# Patient Record
Sex: Female | Born: 1980 | Race: Black or African American | Hispanic: No | Marital: Single | Smoking: Never smoker
Health system: Southern US, Community
[De-identification: ages and names within clinical notes are randomized; demographics above are authoritative.]

## PROBLEM LIST (undated history)

## (undated) DIAGNOSIS — I10 Essential (primary) hypertension: Secondary | ICD-10-CM

## (undated) DIAGNOSIS — D649 Anemia, unspecified: Secondary | ICD-10-CM

## (undated) DIAGNOSIS — M1990 Unspecified osteoarthritis, unspecified site: Secondary | ICD-10-CM

## (undated) DIAGNOSIS — M359 Systemic involvement of connective tissue, unspecified: Secondary | ICD-10-CM

## (undated) DIAGNOSIS — K859 Acute pancreatitis without necrosis or infection, unspecified: Secondary | ICD-10-CM

## (undated) HISTORY — DX: Anemia, unspecified: D64.9

## (undated) HISTORY — PX: ABDOMINAL HYSTERECTOMY: SHX81

## (undated) HISTORY — PX: OTHER SURGICAL HISTORY: SHX169

## (undated) HISTORY — DX: Acute pancreatitis without necrosis or infection, unspecified: K85.90

## (undated) HISTORY — DX: Essential (primary) hypertension: I10

## (undated) HISTORY — PX: CHOLECYSTECTOMY: SHX55

---

## 2004-01-25 ENCOUNTER — Emergency Department: Payer: Self-pay | Admitting: Emergency Medicine

## 2004-04-07 ENCOUNTER — Emergency Department: Payer: Self-pay | Admitting: Emergency Medicine

## 2004-04-16 ENCOUNTER — Emergency Department: Payer: Self-pay | Admitting: Emergency Medicine

## 2004-05-05 ENCOUNTER — Emergency Department: Payer: Self-pay | Admitting: Emergency Medicine

## 2005-04-20 ENCOUNTER — Emergency Department: Payer: Self-pay | Admitting: Emergency Medicine

## 2005-04-25 ENCOUNTER — Other Ambulatory Visit: Payer: Self-pay

## 2005-04-25 ENCOUNTER — Emergency Department: Payer: Self-pay | Admitting: Unknown Physician Specialty

## 2005-06-28 ENCOUNTER — Ambulatory Visit: Payer: Self-pay | Admitting: Vascular Surgery

## 2005-12-16 ENCOUNTER — Emergency Department: Payer: Self-pay | Admitting: General Practice

## 2006-01-04 ENCOUNTER — Other Ambulatory Visit: Payer: Self-pay

## 2006-01-04 ENCOUNTER — Emergency Department: Payer: Self-pay | Admitting: Emergency Medicine

## 2006-02-12 ENCOUNTER — Emergency Department: Payer: Self-pay | Admitting: Emergency Medicine

## 2006-04-16 ENCOUNTER — Emergency Department: Payer: Self-pay | Admitting: Emergency Medicine

## 2006-06-05 ENCOUNTER — Emergency Department: Payer: Self-pay | Admitting: Emergency Medicine

## 2006-07-15 ENCOUNTER — Emergency Department: Payer: Self-pay | Admitting: Emergency Medicine

## 2006-07-15 ENCOUNTER — Other Ambulatory Visit: Payer: Self-pay

## 2006-09-19 ENCOUNTER — Emergency Department: Payer: Self-pay | Admitting: Emergency Medicine

## 2006-11-04 ENCOUNTER — Emergency Department: Payer: Self-pay | Admitting: Internal Medicine

## 2007-11-13 ENCOUNTER — Observation Stay: Payer: Self-pay

## 2007-11-13 ENCOUNTER — Emergency Department: Payer: Self-pay | Admitting: Emergency Medicine

## 2007-11-14 ENCOUNTER — Inpatient Hospital Stay: Payer: Self-pay | Admitting: Obstetrics and Gynecology

## 2008-02-12 ENCOUNTER — Observation Stay: Payer: Self-pay | Admitting: Obstetrics and Gynecology

## 2008-02-27 ENCOUNTER — Observation Stay: Payer: Self-pay | Admitting: Obstetrics and Gynecology

## 2008-03-06 ENCOUNTER — Inpatient Hospital Stay: Payer: Self-pay | Admitting: Obstetrics and Gynecology

## 2008-03-17 ENCOUNTER — Ambulatory Visit: Payer: Self-pay | Admitting: Obstetrics and Gynecology

## 2009-01-06 ENCOUNTER — Emergency Department: Payer: Self-pay | Admitting: Emergency Medicine

## 2009-01-09 ENCOUNTER — Emergency Department: Payer: Self-pay | Admitting: Unknown Physician Specialty

## 2009-01-15 ENCOUNTER — Ambulatory Visit: Payer: Self-pay | Admitting: Gastroenterology

## 2009-02-01 ENCOUNTER — Ambulatory Visit: Payer: Self-pay | Admitting: Gastroenterology

## 2010-01-26 ENCOUNTER — Ambulatory Visit: Payer: Self-pay | Admitting: Obstetrics and Gynecology

## 2010-01-27 ENCOUNTER — Inpatient Hospital Stay: Payer: Self-pay

## 2010-01-29 LAB — PATHOLOGY REPORT

## 2012-03-04 ENCOUNTER — Emergency Department: Payer: Self-pay | Admitting: Emergency Medicine

## 2012-03-04 LAB — MONONUCLEOSIS SCREEN: Mono Test: NEGATIVE

## 2012-03-07 LAB — BETA STREP CULTURE(ARMC)

## 2013-09-11 ENCOUNTER — Emergency Department: Payer: Self-pay | Admitting: Emergency Medicine

## 2013-09-11 LAB — BASIC METABOLIC PANEL
Anion Gap: 2 — ABNORMAL LOW (ref 7–16)
BUN: 11 mg/dL (ref 7–18)
CO2: 29 mmol/L (ref 21–32)
Calcium, Total: 8.7 mg/dL (ref 8.5–10.1)
Chloride: 110 mmol/L — ABNORMAL HIGH (ref 98–107)
Creatinine: 0.79 mg/dL (ref 0.60–1.30)
Glucose: 106 mg/dL — ABNORMAL HIGH (ref 65–99)
OSMOLALITY: 281 (ref 275–301)
POTASSIUM: 3.8 mmol/L (ref 3.5–5.1)
Sodium: 141 mmol/L (ref 136–145)

## 2013-09-11 LAB — CBC WITH DIFFERENTIAL/PLATELET
BASOS PCT: 0.4 %
Basophil #: 0 10*3/uL (ref 0.0–0.1)
Eosinophil #: 0 10*3/uL (ref 0.0–0.7)
Eosinophil %: 0.4 %
HCT: 31.6 % — AB (ref 35.0–47.0)
HGB: 9.8 g/dL — ABNORMAL LOW (ref 12.0–16.0)
LYMPHS PCT: 24.8 %
Lymphocyte #: 2.7 10*3/uL (ref 1.0–3.6)
MCH: 20.8 pg — AB (ref 26.0–34.0)
MCHC: 31 g/dL — ABNORMAL LOW (ref 32.0–36.0)
MCV: 67 fL — AB (ref 80–100)
Monocyte #: 0.8 x10 3/mm (ref 0.2–0.9)
Monocyte %: 6.9 %
NEUTROS PCT: 67.5 %
Neutrophil #: 7.3 10*3/uL — ABNORMAL HIGH (ref 1.4–6.5)
PLATELETS: 321 10*3/uL (ref 150–440)
RBC: 4.7 10*6/uL (ref 3.80–5.20)
RDW: 18.9 % — AB (ref 11.5–14.5)
WBC: 10.9 10*3/uL (ref 3.6–11.0)

## 2014-02-01 ENCOUNTER — Emergency Department: Payer: Self-pay | Admitting: Emergency Medicine

## 2014-02-02 LAB — CBC WITH DIFFERENTIAL/PLATELET
BASOS PCT: 0.4 %
Basophil #: 0.1 10*3/uL (ref 0.0–0.1)
EOS ABS: 0.1 10*3/uL (ref 0.0–0.7)
Eosinophil %: 0.8 %
HCT: 29.9 % — AB (ref 35.0–47.0)
HGB: 9.4 g/dL — ABNORMAL LOW (ref 12.0–16.0)
Lymphocyte #: 4.2 10*3/uL — ABNORMAL HIGH (ref 1.0–3.6)
Lymphocyte %: 30.8 %
MCH: 21 pg — ABNORMAL LOW (ref 26.0–34.0)
MCHC: 31.4 g/dL — AB (ref 32.0–36.0)
MCV: 67 fL — AB (ref 80–100)
Monocyte #: 0.9 x10 3/mm (ref 0.2–0.9)
Monocyte %: 6.9 %
NEUTROS ABS: 8.3 10*3/uL — AB (ref 1.4–6.5)
Neutrophil %: 61.1 %
Platelet: 367 10*3/uL (ref 150–440)
RBC: 4.47 10*6/uL (ref 3.80–5.20)
RDW: 18.9 % — AB (ref 11.5–14.5)
WBC: 13.5 10*3/uL — ABNORMAL HIGH (ref 3.6–11.0)

## 2014-02-02 LAB — COMPREHENSIVE METABOLIC PANEL
ANION GAP: 7 (ref 7–16)
Albumin: 3.4 g/dL (ref 3.4–5.0)
Alkaline Phosphatase: 88 U/L
BUN: 18 mg/dL (ref 7–18)
Bilirubin,Total: 0.2 mg/dL (ref 0.2–1.0)
CO2: 29 mmol/L (ref 21–32)
Calcium, Total: 8.6 mg/dL (ref 8.5–10.1)
Chloride: 102 mmol/L (ref 98–107)
Creatinine: 0.87 mg/dL (ref 0.60–1.30)
EGFR (African American): >60
Glucose: 130 mg/dL — ABNORMAL HIGH (ref 65–99)
OSMOLALITY: 279 (ref 275–301)
POTASSIUM: 3 mmol/L — AB (ref 3.5–5.1)
SGOT(AST): 25 U/L (ref 15–37)
SGPT (ALT): 16 U/L
Sodium: 138 mmol/L (ref 136–145)
Total Protein: 8.6 g/dL — ABNORMAL HIGH (ref 6.4–8.2)

## 2014-02-02 LAB — TROPONIN I

## 2014-02-02 LAB — LIPASE, BLOOD: LIPASE: 482 U/L — AB (ref 73–393)

## 2014-02-10 ENCOUNTER — Emergency Department: Payer: Self-pay | Admitting: Emergency Medicine

## 2014-02-10 LAB — CBC WITH DIFFERENTIAL/PLATELET
Basophil #: 0 10*3/uL (ref 0.0–0.1)
Basophil %: 0.1 %
EOS ABS: 0.1 10*3/uL (ref 0.0–0.7)
EOS PCT: 0.9 %
HCT: 30 % — ABNORMAL LOW (ref 35.0–47.0)
HGB: 9.6 g/dL — ABNORMAL LOW (ref 12.0–16.0)
Lymphocyte #: 0.9 10*3/uL — ABNORMAL LOW (ref 1.0–3.6)
Lymphocyte %: 11.2 %
MCH: 21.3 pg — ABNORMAL LOW (ref 26.0–34.0)
MCHC: 31.9 g/dL — ABNORMAL LOW (ref 32.0–36.0)
MCV: 67 fL — ABNORMAL LOW (ref 80–100)
MONO ABS: 0.5 x10 3/mm (ref 0.2–0.9)
MONOS PCT: 6.5 %
NEUTROS PCT: 81.3 %
Neutrophil #: 6.6 10*3/uL — ABNORMAL HIGH (ref 1.4–6.5)
PLATELETS: 279 10*3/uL (ref 150–440)
RBC: 4.48 10*6/uL (ref 3.80–5.20)
RDW: 18.9 % — ABNORMAL HIGH (ref 11.5–14.5)
WBC: 8.2 10*3/uL (ref 3.6–11.0)

## 2014-02-10 LAB — COMPREHENSIVE METABOLIC PANEL
AST: 76 U/L — AB (ref 15–37)
Albumin: 3.4 g/dL (ref 3.4–5.0)
Alkaline Phosphatase: 80 U/L (ref 46–116)
Anion Gap: 6 — ABNORMAL LOW (ref 7–16)
BILIRUBIN TOTAL: 0.2 mg/dL (ref 0.2–1.0)
BUN: 10 mg/dL (ref 7–18)
CALCIUM: 8.7 mg/dL (ref 8.5–10.1)
Chloride: 103 mmol/L (ref 98–107)
Co2: 30 mmol/L (ref 21–32)
Creatinine: 0.66 mg/dL (ref 0.60–1.30)
Glucose: 110 mg/dL — ABNORMAL HIGH (ref 65–99)
OSMOLALITY: 277 (ref 275–301)
Potassium: 3.4 mmol/L — ABNORMAL LOW (ref 3.5–5.1)
SGPT (ALT): 61 U/L (ref 14–63)
Sodium: 139 mmol/L (ref 136–145)
Total Protein: 7.8 g/dL (ref 6.4–8.2)

## 2014-02-10 LAB — LIPASE, BLOOD: LIPASE: 109 U/L (ref 73–393)

## 2014-06-12 ENCOUNTER — Ambulatory Visit: Payer: Self-pay | Admitting: Oncology

## 2014-06-22 ENCOUNTER — Ambulatory Visit: Payer: Medicaid Other | Admitting: Oncology

## 2014-06-30 ENCOUNTER — Telehealth: Payer: Self-pay | Admitting: *Deleted

## 2014-06-30 ENCOUNTER — Inpatient Hospital Stay: Payer: Medicaid Other | Attending: Hematology and Oncology | Admitting: Hematology and Oncology

## 2014-06-30 ENCOUNTER — Encounter: Payer: Self-pay | Admitting: Hematology and Oncology

## 2014-06-30 VITALS — BP 137/84 | HR 83 | Temp 97.5°F | Resp 20 | Ht 67.0 in | Wt 297.6 lb

## 2014-06-30 DIAGNOSIS — Z79899 Other long term (current) drug therapy: Secondary | ICD-10-CM | POA: Diagnosis not present

## 2014-06-30 DIAGNOSIS — Z800 Family history of malignant neoplasm of digestive organs: Secondary | ICD-10-CM

## 2014-06-30 DIAGNOSIS — N946 Dysmenorrhea, unspecified: Secondary | ICD-10-CM

## 2014-06-30 DIAGNOSIS — I10 Essential (primary) hypertension: Secondary | ICD-10-CM

## 2014-06-30 DIAGNOSIS — R5383 Other fatigue: Secondary | ICD-10-CM | POA: Diagnosis not present

## 2014-06-30 DIAGNOSIS — K861 Other chronic pancreatitis: Secondary | ICD-10-CM | POA: Diagnosis not present

## 2014-06-30 DIAGNOSIS — D509 Iron deficiency anemia, unspecified: Secondary | ICD-10-CM

## 2014-06-30 DIAGNOSIS — F508 Other eating disorders: Secondary | ICD-10-CM | POA: Diagnosis not present

## 2014-06-30 NOTE — Telephone Encounter (Signed)
Asked pt to return my call and let me know when she would like to schedule her Venofer infusion.Cheryl KitchenMarland Santana

## 2014-06-30 NOTE — Progress Notes (Signed)
Patient here today for initial evaluation regarding anemia, referred by [facility]. Patient reports history of anemia, also reports fatigue, weakness and shortness of breath.

## 2014-07-01 DIAGNOSIS — D509 Iron deficiency anemia, unspecified: Secondary | ICD-10-CM | POA: Insufficient documentation

## 2014-07-01 NOTE — Progress Notes (Signed)
Cheryl Santana day:  07/01/2014  Chief Complaint: [name] is a 34 y.o. female with anemia who is referred in consultation by Dr. Kirkland Hun.  HPI:  The patient states that she has had anemia "my whole life". She states that it "runs in the family" (both parents). She states that both her mother and herself had to have a transfusion after delivery. Her paternal aunt also required a blood transfusion.  The patient notes heavy menses. She states that she had her tubes tied in 2012.  She will typically go through a pack of pads or tampons (24) in 2 days. Menses typically last 5 days ; every day is heavy.   At times, she will become lightheaded. There has been some discussion about an endometrial ablation (not been scheduled). She denies any other bruising or bleeding.  She denies specifically any melena, hematochezia or hematuria.  She states that her diet is "not the healthiest".   She eats meat daily. She has some vegetables. She has had ice pica since the last trimester with her daughter (2011).  She will go to [employer] for sheets of ice. She craves the smell of things.  She states that oral iron causes constipation.  She has a history of recurrent pancreatitis.  She underwent endoscopy in 03/2014.  Biopsies were negative.  She stest that she had 2 C-sections and a cholecystectomy without bleeding issues.  Labs on 05/28/2014 revealed a hematocrit of 32, hemoglobin 9.2, MCV 71, platelets 295,000, white count 8100 with an ANC of 4700.  Comprehensive metabolic panel included a creatinine of 0.6. Liver function tests were normal. TSH was normal.  Iron studies revealed an iron saturation of 3% and a TIBC of 395.  Symptomatically, she feels pretty good. Her energy level is fair. She denies any abdominal pain. He states that she is "willing to do IV iron".  Past Medical History  Diagnosis Date  . Anemia   . Pancreatitis   . Hypertension     Past  Surgical History  Procedure Laterality Date  . C-section      Family History  Problem Relation Age of Onset  . Anemia Mother   . Hypertension Mother   . Anemia Paternal Aunt   . Anemia Cousin   . Hypertension Father   . Cancer Other     great aunt- colon cancer    Social History:  reports that she has never smoked. She does not have any smokeless tobacco history on file. She reports that she does not drink alcohol or use illicit drugs.  The patient is alone today.  Allergies: Not on File  Current Medications: Current Outpatient Prescriptions  Medication Sig Dispense Refill  . hydrochlorothiazide (HYDRODIURIL) 25 MG tablet Take 25 mg by mouth daily.     No current facility-administered medications for this visit.    Review of Systems:  GENERAL:  Energy level is fair.  No fevers, sweats or weight loss. PERFORMANCE STATUS (ECOG):  1 HEENT:  Runny nose.  No visual changes, sore throat, mouth sores or tenderness. Lungs: No shortness of breath or cough.  No hemoptysis. Cardiac:  No chest pain, palpitations, orthopnea, or PND. GI:  No nausea, vomiting, diarrhea, constipation, melena or hematochezia. GU:  Heavy menses.  No urgency, frequency, dysuria, or hematuria. Musculoskeletal:  No back pain.  No joint pain.  No muscle tenderness. Extremities:  No pain or swelling. Skin:  No rashes or skin changes. Neuro:  Headache.  No numbness or weakness, balance or coordination issues. Endocrine:  No diabetes, thyroid issues, hot flashes or night sweats. Psych:  No mood changes, depression or anxiety. Pain:  No focal pain. Review of systems:  All other systems reviewed and found to be negative.   Physical Exam: Blood pressure 137/84, pulse 83, temperature 97.5 F (36.4 C), temperature source Oral, resp. rate 20, height 5' 7"  (1.702 m), weight 297 lb 9.9 oz (134.999 kg), SpO2 100 %. GENERAL:  Well developed, well nourished, overweight woman sitting comfortably in the exam room in no  acute distress. MENTAL STATUS:  Alert and oriented to person, place and time. HEAD:  Brown shoulder length hair.  Normocephalic, atraumatic, face symmetric, no Cushingoid features. EYES:  Brown eyes.  Pupils equal round and reactive to light and accomodation.  No conjunctivitis or scleral icterus. ENT:  Oropharynx clear without lesion.  Tongue normal. Mucous membranes moist.  RESPIRATORY:  Clear to auscultation without rales, wheezes or rhonchi. CARDIOVASCULAR:  Regular rate and rhythm without murmur, rub or gallop. ABDOMEN:  Soft, non-tender, with active bowel sounds, and no appreciable hepatosplenomegaly.  No masses. SKIN:  No rashes, ulcers or lesions. EXTREMITIES: No edema, no skin discoloration or tenderness.  No palpable cords. LYMPH NODES: No palpable cervical, supraclavicular, axillary or inguinal adenopathy  NEUROLOGICAL: Unremarkable. PSYCH:  Appropriate.   No visits with results within 3 Day(s) from this visit. Latest known visit with results is:  Cheryl Santana Conversion on 02/10/2014  Component Date Value Ref Range Status  . WBC 02/10/2014 8.2  3.6-11.0 x10 3/mm 3 Final  . RBC 02/10/2014 4.48  3.80-5.20 X10 6/mm 3 Final  . HGB 02/10/2014 9.6* 12.0-16.0 g/dL Final  . HCT 02/10/2014 30.0* 35.0-47.0 % Final  . MCV 02/10/2014 67* 80-100 fL Final  . MCH 02/10/2014 21.3* 26.0-34.0 pg Final  . MCHC 02/10/2014 31.9* 32.0-36.0 g/dL Final  . RDW 02/10/2014 18.9* 11.5-14.5 % Final  . Platelet 02/10/2014 279  150-440 x10 3/mm 3 Final  . Neutrophil % 02/10/2014 81.3   Final  . Lymphocyte % 02/10/2014 11.2   Final  . Monocyte % 02/10/2014 6.5   Final  . Eosinophil % 02/10/2014 0.9   Final  . Basophil % 02/10/2014 0.1   Final  . Neutrophil # 02/10/2014 6.6* 1.4-6.5 x10 3/mm 3 Final  . Lymphocyte # 02/10/2014 0.9* 1.0-3.6 x10 3/mm 3 Final  . Monocyte # 02/10/2014 0.5  0.2-0.9 x10 3/mm  Final  . Eosinophil # 02/10/2014 0.1  0.0-0.7 x10 3/mm 3 Final  . Basophil # 02/10/2014 0.0  0.0-0.1 x10  3/mm 3 Final  . Glucose 02/10/2014 110* 65-99 mg/dL Final  . BUN 02/10/2014 10  7-18 mg/dL Final  . Creatinine 02/10/2014 0.66  0.60-1.30 mg/dL Final  . Sodium 02/10/2014 139  136-145 mmol/L Final  . Potassium 02/10/2014 3.4* 3.5-5.1 mmol/L Final  . Chloride 02/10/2014 103  98-107 mmol/L Final  . Co2 02/10/2014 30  21-32 mmol/L Final  . Calcium, Total 02/10/2014 8.7  8.5-10.1 mg/dL Final  . SGOT(AST) 02/10/2014 76* 15-37 Unit/L Final  . SGPT (ALT) 02/10/2014 61  14-63 U/L Final  . Alkaline Phosphatase 02/10/2014 80  46-116 Unit/L Final  . Albumin 02/10/2014 3.4  3.4-5.0 g/dL Final  . Total Protein 02/10/2014 7.8  6.4-8.2 g/dL Final  . Bilirubin,Total 02/10/2014 0.2  0.2-1.0 mg/dL Final  . Osmolality 02/10/2014 277  275-301 Final  . Anion Gap 02/10/2014 6* 7-16 Final  . EGFR (African American) 02/10/2014 >60  >78m/min Final  .  EGFR (Non-African Amer.) 02/10/2014 >60  >61m/min Final   Comment: eGFR values <653mmin/1.73 m2 may be an indication of chronic kidney disease (CKD). Calculated eGFR, using the MRDR Study equation, is useful in  patients with stable renal function. The eGFR calculation will not be reliable in acutely ill patients when serum creatinine is changing rapidly. It is not useful in patients on dialysis. The eGFR calculation may not be applicable to patients at the low and high extremes of body sizes, pregnant women, and vegetarians.   . Lipase 02/10/2014 109  73-393 Unit/L Final    Assessment:  ToALANEA WOOLRIDGEs a [age].o. female with iron deficiency anemia likely secondary to heavy menses.  She denies any melena or hematochezia.  Diet appears good.  She notes ice pica.  Oral iron causes constipation.  She denies any excess bruising.  She has had 2 C-sections and a cholecystectomy without excess bleeding.   Symptomatically, she is fatigued.  Exam is unremarkable.  Plan: 1. Review diagnosis of iron deficiency anemia.  Discuss diet.  Discuss bleeding history.   Discuss oral iron versus IV iron. 2. Preauth Venofer.  Information on Venofer for patient. 3. Follow-up with gynecology. 4. RTC once approved for Venofer with labs (CBC with diff, ferritin), and weekly Venofer x 4. 5. RTC for MD assessment, labs (CBC with diff, ferritin) on the 4th Venofer.   MeLequita AsalMD  07/01/2014, 5:31 PM

## 2014-07-09 ENCOUNTER — Other Ambulatory Visit: Payer: Medicaid Other

## 2014-07-09 ENCOUNTER — Inpatient Hospital Stay: Payer: Medicaid Other

## 2014-07-09 ENCOUNTER — Ambulatory Visit: Payer: Medicaid Other

## 2014-07-09 DIAGNOSIS — D509 Iron deficiency anemia, unspecified: Secondary | ICD-10-CM | POA: Diagnosis not present

## 2014-07-09 LAB — CBC WITH DIFFERENTIAL/PLATELET
Basophils Absolute: 0 10*3/uL (ref 0–0.1)
Basophils Relative: 0 %
Eosinophils Absolute: 0.2 10*3/uL (ref 0–0.7)
Eosinophils Relative: 2 %
HCT: 31 % — ABNORMAL LOW (ref 35.0–47.0)
Hemoglobin: 9.4 g/dL — ABNORMAL LOW (ref 12.0–16.0)
Lymphocytes Relative: 26 %
Lymphs Abs: 2.3 10*3/uL (ref 1.0–3.6)
MCH: 20.1 pg — ABNORMAL LOW (ref 26.0–34.0)
MCHC: 30.4 g/dL — ABNORMAL LOW (ref 32.0–36.0)
MCV: 65.9 fL — ABNORMAL LOW (ref 80.0–100.0)
Monocytes Absolute: 0.7 10*3/uL (ref 0.2–0.9)
Monocytes Relative: 8 %
Neutro Abs: 5.7 10*3/uL (ref 1.4–6.5)
Neutrophils Relative %: 64 %
Platelets: 316 10*3/uL (ref 150–440)
RBC: 4.7 MIL/uL (ref 3.80–5.20)
RDW: 18.7 % — ABNORMAL HIGH (ref 11.5–14.5)
WBC: 8.9 10*3/uL (ref 3.6–11.0)

## 2014-07-09 LAB — FERRITIN: Ferritin: 6 ng/mL — ABNORMAL LOW (ref 11–307)

## 2014-07-09 NOTE — Progress Notes (Signed)
Patient had labs drawn and Ferritin was not back and patient had to go.  Spoke with Dr. Merlene Pulling and she, or her nurse, will call the patient to reschedule her appointment if needed once Ferritin results are finalized.  Patient was agreeable with plan.  LJ

## 2014-08-03 ENCOUNTER — Inpatient Hospital Stay: Payer: Medicaid Other | Attending: Oncology

## 2014-08-03 ENCOUNTER — Other Ambulatory Visit: Payer: Self-pay

## 2014-08-03 ENCOUNTER — Inpatient Hospital Stay: Payer: Medicaid Other

## 2014-08-03 DIAGNOSIS — D509 Iron deficiency anemia, unspecified: Secondary | ICD-10-CM

## 2014-08-20 ENCOUNTER — Encounter (INDEPENDENT_AMBULATORY_CARE_PROVIDER_SITE_OTHER): Payer: Self-pay

## 2014-08-20 ENCOUNTER — Inpatient Hospital Stay: Payer: Medicaid Other

## 2014-08-20 ENCOUNTER — Inpatient Hospital Stay: Payer: Medicaid Other | Attending: Hematology and Oncology

## 2014-08-20 VITALS — BP 133/83 | HR 73 | Temp 97.4°F | Resp 20

## 2014-08-20 DIAGNOSIS — Z79899 Other long term (current) drug therapy: Secondary | ICD-10-CM | POA: Insufficient documentation

## 2014-08-20 DIAGNOSIS — D509 Iron deficiency anemia, unspecified: Secondary | ICD-10-CM

## 2014-08-20 LAB — CBC WITH DIFFERENTIAL/PLATELET
Basophils Absolute: 0 10*3/uL (ref 0–0.1)
Basophils Relative: 0 %
Eosinophils Absolute: 0.2 10*3/uL (ref 0–0.7)
Eosinophils Relative: 2 %
HCT: 29 % — ABNORMAL LOW (ref 35.0–47.0)
Hemoglobin: 9 g/dL — ABNORMAL LOW (ref 12.0–16.0)
Lymphocytes Relative: 24 %
Lymphs Abs: 2.5 10*3/uL (ref 1.0–3.6)
MCH: 20.2 pg — ABNORMAL LOW (ref 26.0–34.0)
MCHC: 31 g/dL — ABNORMAL LOW (ref 32.0–36.0)
MCV: 65.2 fL — ABNORMAL LOW (ref 80.0–100.0)
Monocytes Absolute: 0.8 10*3/uL (ref 0.2–0.9)
Monocytes Relative: 7 %
Neutro Abs: 7.1 10*3/uL — ABNORMAL HIGH (ref 1.4–6.5)
Neutrophils Relative %: 67 %
Platelets: 318 10*3/uL (ref 150–440)
RBC: 4.45 MIL/uL (ref 3.80–5.20)
RDW: 19 % — ABNORMAL HIGH (ref 11.5–14.5)
WBC: 10.5 10*3/uL (ref 3.6–11.0)

## 2014-08-20 LAB — COMPREHENSIVE METABOLIC PANEL
ALT: 10 U/L — ABNORMAL LOW (ref 14–54)
AST: 13 U/L — ABNORMAL LOW (ref 15–41)
Albumin: 3.6 g/dL (ref 3.5–5.0)
Alkaline Phosphatase: 67 U/L (ref 38–126)
Anion gap: 4 — ABNORMAL LOW (ref 5–15)
BUN: 17 mg/dL (ref 6–20)
CO2: 25 mmol/L (ref 22–32)
Calcium: 7.9 mg/dL — ABNORMAL LOW (ref 8.9–10.3)
Chloride: 106 mmol/L (ref 101–111)
Creatinine, Ser: 0.57 mg/dL (ref 0.44–1.00)
GFR calc Af Amer: >60 mL/min (ref >60)
GFR calc non Af Amer: >60 mL/min (ref >60)
Glucose, Bld: 100 mg/dL — ABNORMAL HIGH (ref 65–99)
Potassium: 3.8 mmol/L (ref 3.5–5.1)
Sodium: 135 mmol/L (ref 135–145)
Total Bilirubin: 0.5 mg/dL (ref 0.3–1.2)
Total Protein: 7.7 g/dL (ref 6.5–8.1)

## 2014-08-20 LAB — FERRITIN: Ferritin: 4 ng/mL — ABNORMAL LOW (ref 11–307)

## 2014-08-20 MED ORDER — SODIUM CHLORIDE 0.9 % IV SOLN
Freq: Once | INTRAVENOUS | Status: AC
  Administered 2014-08-20: 12:00:00 via INTRAVENOUS
  Filled 2014-08-20: qty 1000

## 2014-08-20 MED ORDER — SODIUM CHLORIDE 0.9 % IJ SOLN
3.0000 mL | Freq: Once | INTRAMUSCULAR | Status: DC | PRN
  Filled 2014-08-20: qty 10

## 2014-08-20 MED ORDER — SODIUM CHLORIDE 0.9 % IJ SOLN
10.0000 mL | INTRAMUSCULAR | Status: DC | PRN
  Filled 2014-08-20: qty 10

## 2014-08-20 MED ORDER — HEPARIN SOD (PORK) LOCK FLUSH 100 UNIT/ML IV SOLN: 500.0000 [IU] | Freq: Once | INTRAVENOUS | Status: DC | PRN

## 2014-08-20 MED ORDER — HEPARIN SOD (PORK) LOCK FLUSH 100 UNIT/ML IV SOLN: 250.0000 [IU] | Freq: Once | INTRAVENOUS | Status: DC | PRN

## 2014-08-20 MED ORDER — IRON SUCROSE 20 MG/ML IV SOLN
200.0000 mg | Freq: Once | INTRAVENOUS | Status: AC
  Administered 2014-08-20: 200 mg via INTRAVENOUS
  Filled 2014-08-20: qty 10

## 2014-09-27 ENCOUNTER — Emergency Department: Payer: Medicaid Other

## 2014-09-27 ENCOUNTER — Emergency Department
Admission: EM | Admit: 2014-09-27 | Discharge: 2014-09-27 | Disposition: A | Discharge status: Home / Self Care | Payer: Medicaid Other | Attending: Emergency Medicine | Admitting: Emergency Medicine

## 2014-09-27 DIAGNOSIS — I10 Essential (primary) hypertension: Secondary | ICD-10-CM | POA: Insufficient documentation

## 2014-09-27 DIAGNOSIS — Z79899 Other long term (current) drug therapy: Secondary | ICD-10-CM | POA: Insufficient documentation

## 2014-09-27 DIAGNOSIS — M25562 Pain in left knee: Secondary | ICD-10-CM | POA: Insufficient documentation

## 2014-09-27 DIAGNOSIS — G8929 Other chronic pain: Secondary | ICD-10-CM | POA: Insufficient documentation

## 2014-09-27 MED ORDER — NAPROXEN 500 MG PO TABS: 500.0000 mg | ORAL_TABLET | Freq: Two times a day (BID) | ORAL | Status: DC

## 2014-09-27 NOTE — ED Notes (Signed)
Pt reports she has had pain in both her knees since she was in her 20's. Friday night pt reports her left knee started hurting worse. Pt denies injury.

## 2014-09-27 NOTE — ED Provider Notes (Signed)
[facility] Emergency Department Provider Note  ____________________________________________  Time seen: 3:30 AM  I have reviewed the triage vital signs and the nursing notes.   HISTORY  Chief Complaint Knee Pain    HPI Cheryl Santana is a 34 y.o. female who reports that she has had chronic bilateral knee pain for almost 10 years. Tonight she complains of acutely worsening pain in the left knee for the past 2-3 days. Denies any significant prior injuries, no recent injuries. No chip slips or falls. 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 No recent changes. No fever chills chest pain shortness of breath nausea vomiting diarrhea or abdominal pain. No recent travel trauma hospitalizations or surgeries.     Past Medical History  Diagnosis Date  . Anemia   . Pancreatitis   . Hypertension      Patient Active Problem List   Diagnosis Date Noted  . Iron deficiency anemia 07/01/2014     Past Surgical History  Procedure Laterality Date  . C-section       Current Outpatient Rx  Name  Route  Sig  Dispense  Refill  . hydrochlorothiazide (HYDRODIURIL) 25 MG tablet   Oral   Take 25 mg by mouth daily.         . naproxen (NAPROSYN) 500 MG tablet   Oral   Take 1 tablet (500 mg total) by mouth 2 (two) times daily with a meal.   20 tablet   0      Allergies Review of patient's allergies indicates no known allergies.   Family History  Problem Relation Age of Onset  . Anemia Mother   . Hypertension Mother   . Anemia Paternal Aunt   . Anemia Cousin   . Hypertension Father   . Cancer Other     great aunt- colon cancer    Social History Social History  Substance Use Topics  . Smoking status: Never Smoker   . Smokeless tobacco: Not on file  . Alcohol Use: No    Review of Systems  Constitutional:   No fever or  chills. No weight changes Eyes:   No blurry vision or double vision.  ENT:   No sore throat. Cardiovascular:   No chest pain. Respiratory:   No dyspnea or cough. Gastrointestinal:   Negative for abdominal pain, vomiting and diarrhea.  No BRBPR or melena. Genitourinary:   Negative for dysuria, urinary retention, bloody urine, or difficulty urinating. Musculoskeletal:   Acute on chronic left knee pain Skin:   Negative for rash. Neurological:   Negative for headaches, focal weakness or numbness. Psychiatric:  No anxiety or depression.   Endocrine:  No hot/cold intolerance, changes in energy, or sleep difficulty.  10-point ROS otherwise negative.  ____________________________________________   PHYSICAL EXAM:  VITAL SIGNS: ED Triage Vitals  Enc Vitals Group     BP 09/27/14 0046 129/73 mmHg     Pulse Rate 09/27/14 0046 101     Resp 09/27/14 0046 18     Temp 09/27/14 0046 99 F (37.2 C)     Temp Source 09/27/14 0046 Oral     SpO2 09/27/14 0046 100 %     Weight 09/27/14 0046 285 lb (129.275 kg)     Height 09/27/14 0046 5\' 7"  (1.702 m)     Head Cir --  Peak Flow --      Pain Score 09/27/14 0048 10     Pain Loc --      Pain Edu? --      Excl. in GC? --      Constitutional:   Alert and oriented. Well appearing and in no distress. Eyes:   No scleral icterus. No conjunctival pallor. PERRL. EOMI ENT   Head:   Normocephalic and atraumatic.   Nose:   No congestion/rhinnorhea. No septal hematoma   Mouth/Throat:   MMM, no pharyngeal erythema. No peritonsillar mass. No uvula shift.   Neck:   No stridor. No SubQ emphysema. No meningismus. Hematological/Lymphatic/Immunilogical:   No cervical lymphadenopathy. Cardiovascular:   RRR. Normal and symmetric distal pulses are present in all extremities. No murmurs, rubs, or gallops. Respiratory:   Normal respiratory effort without tachypnea nor retractions. Breath sounds are clear and equal bilaterally. No  wheezes/rales/rhonchi. Gastrointestinal:   Soft and nontender. No distention. There is no CVA tenderness.  No rebound, rigidity, or guarding. Genitourinary:   deferred Musculoskeletal:   Diffuse tenderness about the bony structures of the left knee. The knee is stable. No pain with passive range of motion. There is no effusion. It's not warm indurated or swollen. No crepitus. Neurologic:   Normal speech and language.  CN 2-10 normal. Motor grossly intact. No pronator drift.  Normal gait. No gross focal neurologic deficits are appreciated.  Skin:    Skin is warm, dry and intact. No rash noted.  No petechiae, purpura, or bullae. Psychiatric:   Mood and affect are normal. Speech and behavior are normal. Patient exhibits appropriate insight and judgment.  ____________________________________________    LABS (pertinent positives/negatives) (all labs ordered are listed, but only abnormal results are displayed) Labs Reviewed - No data to display ____________________________________________   EKG    ____________________________________________    RADIOLOGY  X-ray left knee unremarkable  ____________________________________________   PROCEDURES   ____________________________________________   INITIAL IMPRESSION / ASSESSMENT AND PLAN / ED COURSE  Pertinent labs & imaging results that were available during my care of the patient were reviewed by me and considered in my medical decision making (see chart for details).  Patient presents with acute on chronic left knee pain. She has not tried anything for it so far. She is obese and I suspect that she could have early onset arthritis. We'll check an x-ray to evaluate for this, and I felt the patient that she should take Aleve twice a day and follow-up with her doctor. No evidence of any infection, septic arthritis, osteomyelitis, fracture dislocation. No traumatic  injuries.     ____________________________________________   FINAL CLINICAL IMPRESSION(S) / ED DIAGNOSES  Final diagnoses:  Chronic knee pain, left      Sharman Cheek, MD 09/27/14 0502

## 2014-09-27 NOTE — Discharge Instructions (Signed)
Knee Pain °The knee is the complex joint between your thigh and your lower leg. It is made up of bones, tendons, ligaments, and cartilage. The bones that make up the knee are: °· The femur in the thigh. °· The tibia and fibula in the lower leg. °· The patella or kneecap riding in the groove on the lower femur. °CAUSES  °Knee pain is a common complaint with many causes. A few of these causes are: °· Injury, such as: °· A ruptured ligament or tendon injury. °· Torn cartilage. °· Medical conditions, such as: °· Gout °· Arthritis °· Infections °· Overuse, over training, or overdoing a physical activity. °Knee pain can be minor or severe. Knee pain can accompany debilitating injury. Minor knee problems often respond well to self-care measures or get well on their own. More serious injuries may need medical intervention or even surgery. °SYMPTOMS °The knee is complex. Symptoms of knee problems can vary widely. Some of the problems are: °· Pain with movement and weight bearing. °· Swelling and tenderness. °· Buckling of the knee. °· Inability to straighten or extend your knee. °· Your knee locks and you cannot straighten it. °· Warmth and redness with pain and fever. °· Deformity or dislocation of the kneecap. °DIAGNOSIS  °Determining what is wrong may be very straight forward such as when there is an injury. It can also be challenging because of the complexity of the knee. Tests to make a diagnosis may include: °· Your caregiver taking a history and doing a physical exam. °· Routine X-rays can be used to rule out other problems. X-rays will not reveal a cartilage tear. Some injuries of the knee can be diagnosed by: °¨ Arthroscopy a surgical technique by which a small video camera is inserted through tiny incisions on the sides of the knee. This procedure is used to examine and repair internal knee joint problems. Tiny instruments can be used during arthroscopy to repair the torn knee cartilage (meniscus). °¨ Arthrography  is a radiology technique. A contrast liquid is directly injected into the knee joint. Internal structures of the knee joint then become visible on X-ray film. °¨ An MRI scan is a non X-ray radiology procedure in which magnetic fields and a computer produce two- or three-dimensional images of the inside of the knee. Cartilage tears are often visible using an MRI scanner. MRI scans have largely replaced arthrography in diagnosing cartilage tears of the knee. °· Blood work. °· Examination of the fluid that helps to lubricate the knee joint (synovial fluid). This is done by taking a sample out using a needle and a syringe. °TREATMENT °The treatment of knee problems depends on the cause. Some of these treatments are: °· Depending on the injury, proper casting, splinting, surgery, or physical therapy care will be needed. °· Give yourself adequate recovery time. Do not overuse your joints. If you begin to get sore during workout routines, back off. Slow down or do fewer repetitions. °· For repetitive activities such as cycling or running, maintain your strength and nutrition. °· Alternate muscle groups. For example, if you are a weight lifter, work the upper body on one day and the lower body the next. °· Either tight or weak muscles do not give the proper support for your knee. Tight or weak muscles do not absorb the stress placed on the knee joint. Keep the muscles surrounding the knee strong. °· Take care of mechanical problems. °¨ If you have flat feet, orthotics or special shoes may help.   See your caregiver if you need help. °¨ Arch supports, sometimes with wedges on the inner or outer aspect of the heel, can help. These can shift pressure away from the side of the knee most bothered by osteoarthritis. °¨ A brace called an "unloader" brace also may be used to help ease the pressure on the most arthritic side of the knee. °· If your caregiver has prescribed crutches, braces, wraps or ice, use as directed. The acronym  for this is PRICE. This means protection, rest, ice, compression, and elevation. °· Nonsteroidal anti-inflammatory drugs (NSAIDs), can help relieve pain. But if taken immediately after an injury, they may actually increase swelling. Take NSAIDs with food in your stomach. Stop them if you develop stomach problems. Do not take these if you have a history of ulcers, stomach pain, or bleeding from the bowel. Do not take without your caregiver's approval if you have problems with fluid retention, heart failure, or kidney problems. °· For ongoing knee problems, physical therapy may be helpful. °· Glucosamine and chondroitin are over-the-counter dietary supplements. Both may help relieve the pain of osteoarthritis in the knee. These medicines are different from the usual anti-inflammatory drugs. Glucosamine may decrease the rate of cartilage destruction. °· Injections of a corticosteroid drug into your knee joint may help reduce the symptoms of an arthritis flare-up. They may provide pain relief that lasts a few months. You may have to wait a few months between injections. The injections do have a small increased risk of infection, water retention, and elevated blood sugar levels. °· Hyaluronic acid injected into damaged joints may ease pain and provide lubrication. These injections may work by reducing inflammation. A series of shots may give relief for as long as 6 months. °· Topical painkillers. Applying certain ointments to your skin may help relieve the pain and stiffness of osteoarthritis. Ask your pharmacist for suggestions. Many over the-counter products are approved for temporary relief of arthritis pain. °· In some countries, doctors often prescribe topical NSAIDs for relief of chronic conditions such as arthritis and tendinitis. A review of treatment with NSAID creams found that they worked as well as oral medications but without the serious side effects. °PREVENTION °· Maintain a healthy weight. Extra pounds  put more strain on your joints. °· Get strong, stay limber. Weak muscles are a common cause of knee injuries. Stretching is important. Include flexibility exercises in your workouts. °· Be smart about exercise. If you have osteoarthritis, chronic knee pain or recurring injuries, you may need to change the way you exercise. This does not mean you have to stop being active. If your knees ache after jogging or playing basketball, consider switching to swimming, water aerobics, or other low-impact activities, at least for a few days a week. Sometimes limiting high-impact activities will provide relief. °· Make sure your shoes fit well. Choose footwear that is right for your sport. °· Protect your knees. Use the proper gear for knee-sensitive activities. Use kneepads when playing volleyball or laying carpet. Buckle your seat belt every time you drive. Most shattered kneecaps occur in car accidents. °· Rest when you are tired. °SEEK MEDICAL CARE IF:  °You have knee pain that is continual and does not seem to be getting better.  °SEEK IMMEDIATE MEDICAL CARE IF:  °Your knee joint feels hot to the touch and you have a high fever. °MAKE SURE YOU:  °· Understand these instructions. °· Will watch your condition. °· Will get help right away if you are not   doing well or get worse. Document Released: 10/23/2006 Document Revised: 03/20/2011 Document Reviewed: 10/23/2006 [facility] Patient Information 2015 [place], [place]. This information is not intended to replace advice given to you by your health care provider. Make sure you discuss any questions you have with your health care provider.  Musculoskeletal Pain Musculoskeletal pain is muscle and boney aches and pains. These pains can occur in any part of the body. Your caregiver may treat you without knowing the cause of the pain. They may treat you if blood or urine tests, X-rays, and other tests were normal.  CAUSES There is often not a definite cause or reason for these  pains. These pains may be caused by a type of germ (virus). The discomfort may also come from overuse. Overuse includes working out too hard when your body is not fit. Boney aches also come from weather changes. Bone is sensitive to atmospheric pressure changes. HOME CARE INSTRUCTIONS   Ask when your test results will be ready. Make sure you get your test results.  Only take over-the-counter or prescription medicines for pain, discomfort, or fever as directed by your caregiver. If you were given medications for your condition, do not drive, operate machinery or power tools, or sign legal documents for 24 hours. Do not drink alcohol. Do not take sleeping pills or other medications that may interfere with treatment.  Continue all activities unless the activities cause more pain. When the pain lessens, slowly resume normal activities. Gradually increase the intensity and duration of the activities or exercise.  During periods of severe pain, bed rest may be helpful. Lay or sit in any position that is comfortable.  Putting ice on the injured area.  Put ice in a bag.  Place a towel between your skin and the bag.  Leave the ice on for 15 to 20 minutes, 3 to 4 times a day.  Follow up with your caregiver for continued problems and no reason can be found for the pain. If the pain becomes worse or does not go away, it may be necessary to repeat tests or do additional testing. Your caregiver may need to look further for a possible cause. SEEK IMMEDIATE MEDICAL CARE IF:  You have pain that is getting worse and is not relieved by medications.  You develop chest pain that is associated with shortness or breath, sweating, feeling sick to your stomach (nauseous), or throw up (vomit).  Your pain becomes localized to the abdomen.  You develop any new symptoms that seem different or that concern you. MAKE SURE YOU:   Understand these instructions.  Will watch your condition.  Will get help right away  if you are not doing well or get worse. Document Released: 12/26/2004 Document Revised: 03/20/2011 Document Reviewed: 08/30/2012 [facility] Patient Information 2015 [place], [place]. This information is not intended to replace advice given to you by your health care provider. Make sure you discuss any questions you have with your health care provider.

## 2014-09-27 NOTE — ED Notes (Signed)
MD at bedside for eval.

## 2015-02-17 ENCOUNTER — Encounter: Payer: Self-pay | Admitting: Hematology and Oncology

## 2015-05-22 ENCOUNTER — Emergency Department
Admission: EM | Admit: 2015-05-22 | Discharge: 2015-05-22 | Disposition: A | Discharge status: Home / Self Care | Payer: Medicaid Other | Attending: Emergency Medicine | Admitting: Emergency Medicine

## 2015-05-22 ENCOUNTER — Emergency Department: Payer: Medicaid Other

## 2015-05-22 ENCOUNTER — Encounter: Payer: Self-pay | Admitting: Emergency Medicine

## 2015-05-22 DIAGNOSIS — M79642 Pain in left hand: Secondary | ICD-10-CM | POA: Diagnosis present

## 2015-05-22 DIAGNOSIS — I10 Essential (primary) hypertension: Secondary | ICD-10-CM | POA: Insufficient documentation

## 2015-05-22 DIAGNOSIS — M25532 Pain in left wrist: Secondary | ICD-10-CM | POA: Insufficient documentation

## 2015-05-22 MED ORDER — KETOROLAC TROMETHAMINE 60 MG/2ML IM SOLN
60.0000 mg | Freq: Once | INTRAMUSCULAR | Status: AC
  Administered 2015-05-22: 60 mg via INTRAMUSCULAR
  Filled 2015-05-22: qty 2

## 2015-05-22 NOTE — ED Notes (Signed)
Pt verbalized understanding of discharge instructions. NAD at this time. 

## 2015-05-22 NOTE — Discharge Instructions (Signed)
Joint Pain Joint pain, which is also called arthralgia, can be caused by many things. Joint pain often goes away when you follow your health care provider's instructions for relieving pain at home. However, joint pain can also be caused by conditions that require further treatment. Common causes of joint pain include:  Bruising in the area of the joint.  Overuse of the joint.  Wear and tear on the joints that occur with aging (osteoarthritis).  Various other forms of arthritis.  A buildup of a crystal form of uric acid in the joint (gout).  Infections of the joint (septic arthritis) or of the bone (osteomyelitis). Your health care provider may recommend medicine to help with the pain. If your joint pain continues, additional tests may be needed to diagnose your condition. HOME CARE INSTRUCTIONS Watch your condition for any changes. Follow these instructions as directed to lessen the pain that you are feeling.  Take medicines only as directed by your health care provider.  Rest the affected area for as long as your health care provider says that you should. If directed to do so, raise the painful joint above the level of your heart while you are sitting or lying down.  Do not do things that cause or worsen pain.  If directed, apply ice to the painful area:  Put ice in a plastic bag.  Place a towel between your skin and the bag.  Leave the ice on for 20 minutes, 2-3 times per day.  Wear an elastic bandage, splint, or sling as directed by your health care provider. Loosen the elastic bandage or splint if your fingers or toes become numb and tingle, or if they turn cold and blue.  Begin exercising or stretching the affected area as directed by your health care provider. Ask your health care provider what types of exercise are safe for you.  Keep all follow-up visits as directed by your health care provider. This is important. SEEK MEDICAL CARE IF:  Your pain increases, and medicine  does not help.  Your joint pain does not improve within 3 days.  You have increased bruising or swelling.  You have a fever.  You lose 10 lb (4.5 kg) or more without trying. SEEK IMMEDIATE MEDICAL CARE IF:  You are not able to move the joint.  Your fingers or toes become numb or they turn cold and blue.   This information is not intended to replace advice given to you by your health care provider. Make sure you discuss any questions you have with your health care provider.   Document Released: 12/26/2004 Document Revised: 01/16/2014 Document Reviewed: 10/07/2013 Elsevier Interactive Patient Education 2016 Else[employer] Inc.  Cryotherapy Cryotherapy is when you put ice on your injury. Ice helps lessen pain and puffiness (swelling) after an injury. Ice works the best when you start using it in the first 24 to 48 hours after an injury. HOME CARE  Put a dry or damp towel between the ice pack and your skin.  You may press gently on the ice pack.  Leave the ice on for no more than 10 to 20 minutes at a time.  Check your skin after 5 minutes to make sure your skin is okay.  Rest at least 20 minutes between ice pack uses.  Stop using ice when your skin loses feeling (numbness).  Do not use ice on someone who cannot tell you when it hurts. This includes small children and people with memory problems (dementia). GET HELP RIGHT  AWAY IF:  You have white spots on your skin.  Your skin turns blue or pale.  Your skin feels waxy or hard.  Your puffiness gets worse. MAKE SURE YOU:   Understand these instructions.  Will watch your condition.  Will get help right away if you are not doing well or get worse.   This information is not intended to replace advice given to you by your health care provider. Make sure you discuss any questions you have with your health care provider.   Document Released: 06/14/2007 Document Revised: 03/20/2011 Document Reviewed: 08/18/2010 Elsevier  Interactive Patient Education 2016 Else[employer] Inc.  [employer] Therapy Heat therapy can help ease sore, stiff, injured, and tight muscles and joints. Heat relaxes your muscles, which may help ease your pain. Heat therapy should only be used on old, pre-existing, or long-lasting (chronic) injuries. Do not use heat therapy unless told by your doctor. HOW TO USE HEAT THERAPY There are several different kinds of heat therapy, including:  Moist heat pack.  Warm water bath.  Hot water bottle.  Electric heating pad.  Heated gel pack.  Heated wrap.  Electric heating pad. GENERAL HEAT THERAPY RECOMMENDATIONS   Do not sleep while using heat therapy. Only use heat therapy while you are awake.  Your skin may turn pink while using heat therapy. Do not use heat therapy if your skin turns red.  Do not use heat therapy if you have new pain.  High heat or long exposure to heat can cause burns. Be careful when using heat therapy to avoid burning your skin.  Do not use heat therapy on areas of your skin that are already irritated, such as with a rash or sunburn. GET HELP IF:   You have blisters, redness, swelling (puffiness), or numbness.  You have new pain.  Your pain is worse. MAKE SURE YOU:  Understand these instructions.  Will watch your condition.  Will get help right away if you are not doing well or get worse.   This information is not intended to replace advice given to you by your health care provider. Make sure you discuss any questions you have with your health care provider.   Document Released: 03/20/2011 Document Revised: 01/16/2014 Document Reviewed: 02/18/2013 Elsevier Interactive Patient Education [employer]

## 2015-05-22 NOTE — ED Notes (Signed)
States L hand pain x 1 month. Does a lot of lifting at work. States has had rotating joint pains x 1 month.

## 2015-05-22 NOTE — ED Notes (Signed)
C/o left hand/wrist pain.  +PMS.  Reports lifting a lot at work. No obvious deformity.

## 2015-05-22 NOTE — ED Provider Notes (Signed)
[facility] Emergency Department Provider Note  ____________________________________________  Time seen: Approximately 12:44 PM  I have reviewed the triage vital signs and the nursing notes.   HISTORY  Chief Complaint Hand Pain    HPI Cheryl Santana is a 35 y.o. female , NAD, presents to the emergency department with chronic, off and on left hand and wrist pain. Has been seen by her primary care provider and given Naprosyn which is not been helping. Denies any falls, injuries or other traumas to the hand or wrist. States that she has had very eating joint pain in her shoulders, hands, wrists over the last month. Her primary provider at [facility] primary care in [place], [place] is working up this health concern. Patient denies any redness, swelling, rashes, open wounds or sores to the area. Has had some numbness and tingling and notes that she can drop items that she's holding from time to time. Denies headaches, changes in speech or gait, chest pain, shortness of breath. At this time she has no other joint pain.   Past Medical History  Diagnosis Date  . Anemia   . Pancreatitis   . Hypertension     Patient Active Problem List   Diagnosis Date Noted  . Iron deficiency anemia 07/01/2014    Past Surgical History  Procedure Laterality Date  . C-section      Current Outpatient Rx  Name  Route  Sig  Dispense  Refill  . hydrochlorothiazide (HYDRODIURIL) 25 MG tablet   Oral   Take 25 mg by mouth daily.         . naproxen (NAPROSYN) 500 MG tablet   Oral   Take 1 tablet (500 mg total) by mouth 2 (two) times daily with a meal.   20 tablet   0     Allergies Review of patient's allergies indicates no known allergies.  Family History  Problem Relation Age of Onset  . Anemia Mother   . Hypertension Mother   . Anemia Paternal Aunt   . Anemia Cousin   . Hypertension Father   . Cancer Other     great aunt- colon cancer    Social History Social  History  Substance Use Topics  . Smoking status: Never Smoker   . Smokeless tobacco: None  . Alcohol Use: No     Review of Systems  Constitutional: No fever/chills, fatigue Eyes: No visual changes.  Cardiovascular: No chest pain. Respiratory: No shortness of breath.  Musculoskeletal: Positive left hand and wrist pain. Negative neck pain. Skin: Negative for rash, redness, swelling, bruising, skin source, lacerations. Neurological: Negative for headaches, focal weakness or numbness. No tingling. No changes in speech or gait. 10-point ROS otherwise negative.  ____________________________________________   PHYSICAL EXAM:  VITAL SIGNS: ED Triage Vitals  Enc Vitals Group     BP 05/22/15 1225 148/93 mmHg     Pulse Rate 05/22/15 1225 90     Resp 05/22/15 1225 18     Temp 05/22/15 1225 98.1 F (36.7 C)     Temp Source 05/22/15 1225 Oral     SpO2 05/22/15 1225 99 %     Weight 05/22/15 1225 280 lb (127.007 kg)     Height 05/22/15 1225 5\' 7"  (1.702 m)     Head Cir --      Peak Flow --      Pain Score 05/22/15 1226 9     Pain Loc --      Pain Edu? --  Excl. in GC? --      Constitutional: Alert and oriented. Well appearing and in no acute distress. Eyes: Conjunctivae are normal.  Head: Atraumatic. Cardiovascular: Good peripheral circulationWith 2+ pulses noted in the left upper extremity. Capillary refill is brisk in all digits of the left upper extremity. Respiratory: Normal respiratory effort without tachypnea or retractions.  Musculoskeletal: Tenderness to palpation about the left wrist diffusely. No specific or point tenderness to palpation about the left hand or fingers. Patient has full range of motion of the left wrist but with discomfort with flexion and extension. Patient able to make a fist with 90% range of motion but with discomfort. Full range of motion of the digits of the left hand. Negative Tinel's. Negative Phalen's. No joint effusions. Neurologic:  Normal  speech and language.  Sensation about the left upper extremity grossly intact to light touch. Skin:  Skin is warm, dry and intact. No rash, redness, swelling, skin source, lacerations noted. Psychiatric: Mood and affect are normal. Speech and behavior are normal. Patient exhibits appropriate insight and judgement.   ____________________________________________   LABS  None ____________________________________________  EKG  None ____________________________________________  RADIOLOGY I have personally viewed and evaluated these images (plain radiographs) as part of my medical decision making, as well as reviewing the written report by the radiologist.  Dg Hand Complete Left  05/22/2015  CLINICAL DATA:  Left hand pain for 1 month, no known injury EXAM: LEFT HAND - COMPLETE 3+ VIEW COMPARISON:  None. FINDINGS: Three views of the left hand submitted. No acute fracture or subluxation. No radiopaque foreign body. No periosteal reaction or bony erosion. IMPRESSION: Negative. Electronically Signed   By: Natasha Mead M.D.   On: 05/22/2015 13:14    ____________________________________________    PROCEDURES  Procedure(s) performed: None    Medications  ketorolac (TORADOL) injection 60 mg (not administered)     ____________________________________________   INITIAL IMPRESSION / ASSESSMENT AND PLAN / ED COURSE  Pertinent imaging results that were available during my care of the patient were reviewed by me and considered in my medical decision making (see chart for details).  Patient's diagnosis is consistent with Arthralgia of the left wrist. Patient was placed in a left wrist splint for comfort care.  Patient is to follow up with her primary care provider if symptoms persist past this treatment course and to continue workup. Patient is given ED precautions to return to the ED for any worsening or new symptoms.    ____________________________________________  FINAL CLINICAL  IMPRESSION(S) / ED DIAGNOSES  Final diagnoses:  Arthralgia of left wrist      NEW MEDICATIONS STARTED DURING THIS VISIT:  New Prescriptions   No medications on file         Hope Pigeon, PA-C 05/22/15 1356  Myrna Blazer, MD 05/22/15 [phone]

## 2015-05-26 ENCOUNTER — Other Ambulatory Visit: Payer: Self-pay | Admitting: Family Medicine

## 2015-05-26 DIAGNOSIS — E049 Nontoxic goiter, unspecified: Secondary | ICD-10-CM

## 2015-05-31 ENCOUNTER — Ambulatory Visit
Admission: RE | Admit: 2015-05-31 | Discharge: 2015-05-31 | Disposition: A | Discharge status: Home / Self Care | Payer: Medicaid Other | Source: Ambulatory Visit | Attending: Family Medicine | Admitting: Family Medicine

## 2015-05-31 DIAGNOSIS — E049 Nontoxic goiter, unspecified: Secondary | ICD-10-CM

## 2015-05-31 DIAGNOSIS — E042 Nontoxic multinodular goiter: Secondary | ICD-10-CM | POA: Diagnosis not present

## 2015-06-16 ENCOUNTER — Ambulatory Visit: Payer: Medicaid Other

## 2015-06-16 ENCOUNTER — Other Ambulatory Visit: Payer: Medicaid Other

## 2015-06-16 ENCOUNTER — Other Ambulatory Visit: Payer: Self-pay | Admitting: Hematology and Oncology

## 2015-06-16 ENCOUNTER — Ambulatory Visit: Payer: Medicaid Other | Admitting: Hematology and Oncology

## 2015-06-25 ENCOUNTER — Other Ambulatory Visit: Payer: Medicaid Other

## 2015-06-25 ENCOUNTER — Other Ambulatory Visit: Payer: Self-pay | Admitting: *Deleted

## 2015-06-25 DIAGNOSIS — D509 Iron deficiency anemia, unspecified: Secondary | ICD-10-CM

## 2015-06-29 ENCOUNTER — Inpatient Hospital Stay: Payer: Medicaid Other | Attending: Family Medicine

## 2015-06-30 ENCOUNTER — Other Ambulatory Visit: Payer: Self-pay | Admitting: Hematology and Oncology

## 2015-06-30 ENCOUNTER — Other Ambulatory Visit: Payer: Medicaid Other

## 2015-06-30 ENCOUNTER — Ambulatory Visit: Payer: Medicaid Other | Admitting: Hematology and Oncology

## 2015-06-30 ENCOUNTER — Ambulatory Visit: Payer: Medicaid Other

## 2015-12-10 ENCOUNTER — Emergency Department
Admission: EM | Admit: 2015-12-10 | Discharge: 2015-12-11 | Disposition: A | Discharge status: Home / Self Care | Payer: Medicaid Other | Attending: Emergency Medicine | Admitting: Emergency Medicine

## 2015-12-10 ENCOUNTER — Encounter: Payer: Self-pay | Admitting: Emergency Medicine

## 2015-12-10 DIAGNOSIS — I10 Essential (primary) hypertension: Secondary | ICD-10-CM | POA: Insufficient documentation

## 2015-12-10 DIAGNOSIS — Z79899 Other long term (current) drug therapy: Secondary | ICD-10-CM | POA: Insufficient documentation

## 2015-12-10 DIAGNOSIS — G43709 Chronic migraine without aura, not intractable, without status migrainosus: Secondary | ICD-10-CM

## 2015-12-10 HISTORY — DX: Unspecified osteoarthritis, unspecified site: M19.90

## 2015-12-10 LAB — CBC WITH DIFFERENTIAL/PLATELET
BASOS ABS: 0 10*3/uL (ref 0–0.1)
BASOS PCT: 0 %
EOS ABS: 0 10*3/uL (ref 0–0.7)
Eosinophils Relative: 0 %
HCT: 29.3 % — ABNORMAL LOW (ref 35.0–47.0)
Hemoglobin: 9.1 g/dL — ABNORMAL LOW (ref 12.0–16.0)
Lymphocytes Relative: 21 %
Lymphs Abs: 2.6 10*3/uL (ref 1.0–3.6)
MCH: 20.9 pg — ABNORMAL LOW (ref 26.0–34.0)
MCHC: 31.2 g/dL — ABNORMAL LOW (ref 32.0–36.0)
MCV: 67.1 fL — ABNORMAL LOW (ref 80.0–100.0)
MONO ABS: 0.9 10*3/uL (ref 0.2–0.9)
Monocytes Relative: 7 %
Neutro Abs: 9 10*3/uL — ABNORMAL HIGH (ref 1.4–6.5)
Neutrophils Relative %: 72 %
PLATELETS: 340 10*3/uL (ref 150–440)
RBC: 4.37 MIL/uL (ref 3.80–5.20)
RDW: 19.1 % — AB (ref 11.5–14.5)
WBC: 12.5 10*3/uL — AB (ref 3.6–11.0)

## 2015-12-10 LAB — BASIC METABOLIC PANEL
Anion gap: 6 (ref 5–15)
BUN: 12 mg/dL (ref 6–20)
CALCIUM: 8.8 mg/dL — AB (ref 8.9–10.3)
CO2: 25 mmol/L (ref 22–32)
Chloride: 105 mmol/L (ref 101–111)
Creatinine, Ser: 0.64 mg/dL (ref 0.44–1.00)
GFR calc Af Amer: >60 mL/min (ref >60)
Glucose, Bld: 85 mg/dL (ref 65–99)
POTASSIUM: 3.7 mmol/L (ref 3.5–5.1)
SODIUM: 136 mmol/L (ref 135–145)

## 2015-12-10 MED ORDER — ONDANSETRON HCL 4 MG/2ML IJ SOLN
4.0000 mg | Freq: Once | INTRAMUSCULAR | Status: AC
  Administered 2015-12-11: 4 mg via INTRAVENOUS
  Filled 2015-12-10: qty 2

## 2015-12-10 MED ORDER — KETOROLAC TROMETHAMINE 30 MG/ML IJ SOLN
30.0000 mg | Freq: Once | INTRAMUSCULAR | Status: AC
  Administered 2015-12-11: 30 mg via INTRAVENOUS
  Filled 2015-12-10: qty 1

## 2015-12-10 MED ORDER — SODIUM CHLORIDE 0.9 % IV BOLUS (SEPSIS)
1000.0000 mL | Freq: Once | INTRAVENOUS | Status: AC
  Administered 2015-12-11: 1000 mL via INTRAVENOUS

## 2015-12-10 NOTE — ED Triage Notes (Signed)
Pt states that since Wednesday night she has had a headache. Pt has hx of migraines and has taken two BC powder today without relief. Pt denies and LOC but state that at times she feels lightheaded. Pt is ambulatory to triage with NAD noted at this time.

## 2015-12-10 NOTE — ED Notes (Signed)
Pt reports headache since Tuesday. Has been trying [facility] Goody powder without relief. Reports headache in frontal area and behind eyes. Reports pain is pressure in nature and constant. Reports that she has been having sound sensitivity, denies light sensitivity. Denies N/V. Reports some dizziness at times. Reports that she would consider this the worst HA of her life only because it won't go away.

## 2015-12-11 ENCOUNTER — Emergency Department: Payer: Medicaid Other

## 2015-12-11 MED ORDER — OXYCODONE-ACETAMINOPHEN 5-325 MG PO TABS
ORAL_TABLET | ORAL | Status: AC
  Administered 2015-12-11: 1 via ORAL
  Filled 2015-12-11: qty 1

## 2015-12-11 MED ORDER — TRAMADOL HCL 50 MG PO TABS: 50.0000 mg | ORAL_TABLET | Freq: Four times a day (QID) | ORAL | 0 refills | Status: DC | PRN

## 2015-12-11 MED ORDER — OXYCODONE-ACETAMINOPHEN 5-325 MG PO TABS
1.0000 | ORAL_TABLET | Freq: Once | ORAL | Status: AC
  Administered 2015-12-11: 1 via ORAL

## 2015-12-11 NOTE — ED Provider Notes (Signed)
[facility] Emergency Department Provider Note    First MD Initiated Contact with Patient 12/10/15 2306     (approximate)  I have reviewed the triage vital signs and the nursing notes.   HISTORY  Chief Complaint Headache    HPI Cheryl Santana is a 35 y.o. female triage "migraine headaches" presents with persistent headache since Wednesday night. Patient states her current pain score is 10 out of 10. Patient states that her pain was unrelieved by taking 2 BC powders at home. Patient denies any weakness numbness or gait instability or visual changes. Patient does however admit to feeling "lightheaded". He nausea or vomiting. Patient states that she has never received evaluation for migraines. Patient states that she "thinks that she may have had a MRI or CT scan done in the past".   Past Medical History:  Diagnosis Date  . Anemia   . Arthritis   . Hypertension   . Pancreatitis     Patient Active Problem List   Diagnosis Date Noted  . Iron deficiency anemia 07/01/2014    Past Surgical History:  Procedure Laterality Date  . c-section    . CHOLECYSTECTOMY      Prior to Admission medications   Medication Sig Start Date End Date Taking? Authorizing Provider  hydrochlorothiazide (HYDRODIURIL) 25 MG tablet Take 25 mg by mouth daily.    Historical Provider, MD  naproxen (NAPROSYN) 500 MG tablet Take 1 tablet (500 mg total) by mouth 2 (two) times daily with a meal. 09/27/14   Sharman Cheek, MD    Allergies Patient has no known allergies.  Family History  Problem Relation Age of Onset  . Anemia Mother   . Hypertension Mother   . Anemia Paternal Aunt   . Anemia Cousin   . Hypertension Father   . Cancer Other     great aunt- colon cancer    Social History Social History  Substance Use Topics  . Smoking status: Never Smoker  . Smokeless tobacco: Never Used  . Alcohol use No    Review of Systems Constitutional: No fever/chills Eyes: No  visual changes. ENT: No sore throat. Cardiovascular: Denies chest pain. Respiratory: Denies shortness of breath. Gastrointestinal: No abdominal pain.  No nausea, no vomiting.  No diarrhea.  No constipation. Genitourinary: Negative for dysuria. Musculoskeletal: Negative for back pain. Skin: Negative for rash. Neurological: Positive for headaches and lightheaded  10-point ROS otherwise negative.  ____________________________________________   PHYSICAL EXAM:  VITAL SIGNS: ED Triage Vitals  Enc Vitals Group     BP 12/10/15 2209 137/87     Pulse Rate 12/10/15 2209 88     Resp 12/10/15 2209 18     Temp 12/10/15 2209 98.8 F (37.1 C)     Temp Source 12/10/15 2209 Oral     SpO2 12/10/15 2209 99 %     Weight 12/10/15 2213 255 lb (115.7 kg)     Height 12/10/15 2213 5\' 7"  (1.702 m)     Head Circumference --      Peak Flow --      Pain Score 12/10/15 2222 10     Pain Loc --      Pain Edu? --      Excl. in GC? --     Constitutional: Alert and oriented. Well appearing and in no acute distress. Eyes: Conjunctivae are normal. PERRL. EOMI. Head: Atraumatic. Mouth/Throat: Mucous membranes are moist.  Oropharynx non-erythematous. Neck: No stridor.  No meningeal signs.  Cardiovascular: Normal rate,  regular rhythm. Good peripheral circulation. Grossly normal heart sounds. Respiratory: Normal respiratory effort.  No retractions. Lungs CTAB. Gastrointestinal: Soft and nontender. No distention.  Musculoskeletal: No lower extremity tenderness nor edema. No gross deformities of extremities. Neurologic:  Normal speech and language. No gross focal neurologic deficits are appreciated.  Skin:  Skin is warm, dry and intact. No rash noted. Psychiatric: Mood and affect are normal. Speech and behavior are normal.  ____________________________________________   LABS (all labs ordered are listed, but only abnormal results are displayed)  Labs Reviewed  CBC WITH DIFFERENTIAL/PLATELET - Abnormal;  Notable for the following:       Result Value   WBC 12.5 (*)    Hemoglobin 9.1 (*)    HCT 29.3 (*)    MCV 67.1 (*)    MCH 20.9 (*)    MCHC 31.2 (*)    RDW 19.1 (*)    Neutro Abs 9.0 (*)    All other components within normal limits  BASIC METABOLIC PANEL - Abnormal; Notable for the following:    Calcium 8.8 (*)    All other components within normal limits    RADIOLOGY I, [place] N Shine Mikes, personally viewed and evaluated these images (plain radiographs) as part of my medical decision making, as well as reviewing the written report by the radiologist.  Ct Head Wo Contrast  Result Date: 12/11/2015 CLINICAL DATA:  Headache since Wednesday night. EXAM: CT HEAD WITHOUT CONTRAST TECHNIQUE: Contiguous axial images were obtained from the base of the skull through the vertex without intravenous contrast. COMPARISON:  12/16/2005 FINDINGS: BRAIN: The ventricles and sulci are normal. No intraparenchymal hemorrhage, mass effect nor midline shift. No acute large vascular territory infarcts. Grey-white matter distinction is maintained. The basal ganglia are unremarkable. No abnormal extra-axial fluid collections. Basal cisterns are patent. The brainstem and cerebellar hemispheres are without acute abnormalities. VASCULAR: Unremarkable. SKULL/SOFT TISSUES: No skull fracture. No significant soft tissue swelling. ORBITS/SINUSES: The included ocular globes and orbital contents are normal.The mastoid air cells are clear. The included paranasal sinuses are well-aerated. OTHER: None. IMPRESSION: No acute intracranial abnormality. Electronically Signed   By: Tollie Eth M.D.   On: 12/11/2015 00:20      Procedures     INITIAL IMPRESSION / ASSESSMENT AND PLAN / ED COURSE  Pertinent labs & imaging results that were available during my care of the patient were reviewed by me and considered in my medical decision making (see chart for details).     Clinical Course      ____________________________________________  FINAL CLINICAL IMPRESSION(S) / ED DIAGNOSES  Final diagnoses:  Chronic migraine without aura without status migrainosus, not intractable     MEDICATIONS GIVEN DURING THIS VISIT:  Medications  sodium chloride 0.9 % bolus 1,000 mL (1,000 mLs Intravenous New Bag/Given 12/11/15 0024)  ketorolac (TORADOL) 30 MG/ML injection 30 mg (30 mg Intravenous Given 12/11/15 0024)  ondansetron (ZOFRAN) injection 4 mg (4 mg Intravenous Given 12/11/15 0023)     NEW OUTPATIENT MEDICATIONS STARTED DURING THIS VISIT:  New Prescriptions   No medications on file    Modified Medications   No medications on file    Discontinued Medications   No medications on file     Note:  This document was prepared using Dragon voice recognition software and may include unintentional dictation errors.    Darci Current, MD 12/11/15 [phone]

## 2016-01-09 ENCOUNTER — Emergency Department: Payer: Self-pay

## 2016-01-09 ENCOUNTER — Emergency Department
Admission: EM | Admit: 2016-01-09 | Discharge: 2016-01-10 | Disposition: A | Discharge status: Home / Self Care | Payer: Self-pay | Attending: Emergency Medicine | Admitting: Emergency Medicine

## 2016-01-09 ENCOUNTER — Encounter: Payer: Self-pay | Admitting: Emergency Medicine

## 2016-01-09 DIAGNOSIS — Z79899 Other long term (current) drug therapy: Secondary | ICD-10-CM | POA: Insufficient documentation

## 2016-01-09 DIAGNOSIS — R1031 Right lower quadrant pain: Secondary | ICD-10-CM

## 2016-01-09 DIAGNOSIS — N200 Calculus of kidney: Secondary | ICD-10-CM | POA: Insufficient documentation

## 2016-01-09 DIAGNOSIS — I10 Essential (primary) hypertension: Secondary | ICD-10-CM | POA: Insufficient documentation

## 2016-01-09 HISTORY — DX: Systemic involvement of connective tissue, unspecified: M35.9

## 2016-01-09 LAB — CBC WITH DIFFERENTIAL/PLATELET
BASOS PCT: 0 %
Basophils Absolute: 0 10*3/uL (ref 0–0.1)
Eosinophils Absolute: 0.1 10*3/uL (ref 0–0.7)
Eosinophils Relative: 1 %
HEMATOCRIT: 28.5 % — AB (ref 35.0–47.0)
Hemoglobin: 9 g/dL — ABNORMAL LOW (ref 12.0–16.0)
LYMPHS ABS: 1.8 10*3/uL (ref 1.0–3.6)
Lymphocytes Relative: 16 %
MCH: 20.8 pg — AB (ref 26.0–34.0)
MCHC: 31.7 g/dL — AB (ref 32.0–36.0)
MCV: 65.7 fL — AB (ref 80.0–100.0)
MONO ABS: 0.9 10*3/uL (ref 0.2–0.9)
MONOS PCT: 7 %
NEUTROS ABS: 8.8 10*3/uL — AB (ref 1.4–6.5)
Neutrophils Relative %: 76 %
Platelets: 334 10*3/uL (ref 150–440)
RBC: 4.33 MIL/uL (ref 3.80–5.20)
RDW: 17.8 % — AB (ref 11.5–14.5)
WBC: 11.6 10*3/uL — ABNORMAL HIGH (ref 3.6–11.0)

## 2016-01-09 LAB — BASIC METABOLIC PANEL
Anion gap: 9 (ref 5–15)
BUN: 12 mg/dL (ref 6–20)
CALCIUM: 9 mg/dL (ref 8.9–10.3)
CO2: 26 mmol/L (ref 22–32)
CREATININE: 0.7 mg/dL (ref 0.44–1.00)
Chloride: 103 mmol/L (ref 101–111)
GFR calc non Af Amer: >60 mL/min (ref >60)
GLUCOSE: 119 mg/dL — AB (ref 65–99)
Potassium: 3.6 mmol/L (ref 3.5–5.1)
Sodium: 138 mmol/L (ref 135–145)

## 2016-01-09 LAB — URINALYSIS, ROUTINE W REFLEX MICROSCOPIC
BILIRUBIN URINE: NEGATIVE
Bacteria, UA: NONE SEEN
GLUCOSE, UA: NEGATIVE mg/dL
KETONES UR: NEGATIVE mg/dL
Leukocytes, UA: NEGATIVE
NITRITE: NEGATIVE
PH: 6 (ref 5.0–8.0)
Protein, ur: NEGATIVE mg/dL
Specific Gravity, Urine: 1.015 (ref 1.005–1.030)

## 2016-01-09 LAB — WET PREP, GENITAL
SPERM: NONE SEEN
Trich, Wet Prep: NONE SEEN
Yeast Wet Prep HPF POC: NONE SEEN

## 2016-01-09 LAB — POCT PREGNANCY, URINE: Preg Test, Ur: NEGATIVE

## 2016-01-09 MED ORDER — ONDANSETRON HCL 4 MG/2ML IJ SOLN
INTRAMUSCULAR | Status: AC
  Filled 2016-01-09: qty 2

## 2016-01-09 MED ORDER — IOPAMIDOL (ISOVUE-300) INJECTION 61%
125.0000 mL | Freq: Once | INTRAVENOUS | Status: AC | PRN
  Administered 2016-01-10: 125 mL via INTRAVENOUS

## 2016-01-09 MED ORDER — IOPAMIDOL (ISOVUE-300) INJECTION 61%
30.0000 mL | Freq: Once | INTRAVENOUS | Status: AC | PRN
  Administered 2016-01-09: 30 mL via ORAL

## 2016-01-09 MED ORDER — MORPHINE SULFATE (PF) 4 MG/ML IV SOLN: 4.0000 mg | Freq: Once | INTRAVENOUS | Status: DC

## 2016-01-09 MED ORDER — MORPHINE SULFATE (PF) 4 MG/ML IV SOLN
INTRAVENOUS | Status: AC
  Filled 2016-01-09: qty 1

## 2016-01-09 MED ORDER — ONDANSETRON HCL 4 MG/2ML IJ SOLN: 4.0000 mg | Freq: Once | INTRAMUSCULAR | Status: DC

## 2016-01-09 MED ORDER — SODIUM CHLORIDE 0.9 % IV BOLUS (SEPSIS)
1000.0000 mL | Freq: Once | INTRAVENOUS | Status: AC
  Administered 2016-01-09: 1000 mL via INTRAVENOUS

## 2016-01-09 MED ORDER — ONDANSETRON HCL 4 MG/2ML IJ SOLN
4.0000 mg | Freq: Once | INTRAMUSCULAR | Status: AC
  Administered 2016-01-09: 4 mg via INTRAVENOUS

## 2016-01-09 MED ORDER — MORPHINE SULFATE (PF) 4 MG/ML IV SOLN
4.0000 mg | Freq: Once | INTRAVENOUS | Status: AC
  Administered 2016-01-09: 4 mg via INTRAVENOUS

## 2016-01-09 NOTE — ED Provider Notes (Signed)
[facility] Emergency Department Provider Note ____________________________________________   I have reviewed the triage vital signs and the triage nursing note.  HISTORY  Chief Complaint Pelvic Pain   Historian Patient  HPI Cheryl Santana is a 35 y.o. female presents with right lower quadrant pain ongoing all day. Yesterday she had some left lower quadrant pain into the mid abdomen but that's gone now. Pain in the right lower quadrant now is 9 out of 10. No reported recent episodes of this although she does state that years ago she had an ovarian cyst. Denies vaginal discharge. States that last period was probably about 2 weeks ago. Mild nausea without vomiting. No diarrhea. No fever. No coughing or chest pain.    Past Medical History:  Diagnosis Date  . Anemia   . Arthritis   . Hypertension   . Pancreatitis     Patient Active Problem List   Diagnosis Date Noted  . Iron deficiency anemia 07/01/2014    Past Surgical History:  Procedure Laterality Date  . c-section    . CESAREAN SECTION    . CHOLECYSTECTOMY      Prior to Admission medications   Medication Sig Start Date End Date Taking? Authorizing Provider  hydrochlorothiazide (HYDRODIURIL) 25 MG tablet Take 25 mg by mouth daily.    Historical Provider, MD  naproxen (NAPROSYN) 500 MG tablet Take 1 tablet (500 mg total) by mouth 2 (two) times daily with a meal. 09/27/14   Sharman Cheek, MD  traMADol (ULTRAM) 50 MG tablet Take 1 tablet (50 mg total) by mouth every 6 (six) hours as needed. 12/11/15 12/10/16  Darci Current, MD    No Known Allergies  Family History  Problem Relation Age of Onset  . Anemia Mother   . Hypertension Mother   . Anemia Paternal Aunt   . Anemia Cousin   . Hypertension Father   . Cancer Other     great aunt- colon cancer    Social History Social History  Substance Use Topics  . Smoking status: Never Smoker  . Smokeless tobacco: Never Used  . Alcohol use No     Review of Systems  Constitutional: Negative for fever. Eyes: Negative for visual changes. ENT: Negative for sore throat. Cardiovascular: Negative for chest pain. Respiratory: Negative for shortness of breath. Gastrointestinal: Negative for vomiting and diarrhea. Genitourinary: Negative for dysuria. Musculoskeletal: Negative for back pain. Skin: Negative for rash. Neurological: Negative for headache. 10 point Review of Systems otherwise negative ____________________________________________   PHYSICAL EXAM:  VITAL SIGNS: ED Triage Vitals  Enc Vitals Group     BP 01/09/16 1955 (!) 156/82     Pulse Rate 01/09/16 1955 83     Resp 01/09/16 1955 18     Temp 01/09/16 1955 98.2 F (36.8 C)     Temp Source 01/09/16 1955 Oral     SpO2 01/09/16 1955 100 %     Weight 01/09/16 1955 265 lb (120.2 kg)     Height 01/09/16 1955 5\' 7"  (1.702 m)     Head Circumference --      Peak Flow --      Pain Score 01/09/16 1956 10     Pain Loc --      Pain Edu? --      Excl. in GC? --      Constitutional: Alert and oriented. Well appearing and in no distress. HEENT   Head: Normocephalic and atraumatic.      Eyes: Conjunctivae are normal.  PERRL. Normal extraocular movements.      Ears:         Nose: No congestion/rhinnorhea.   Mouth/Throat: Mucous membranes are moist.   Neck: No stridor. Cardiovascular/Chest: Normal rate, regular rhythm.  No murmurs, rubs, or gallops. Respiratory: Normal respiratory effort without tachypnea nor retractions. Breath sounds are clear and equal bilaterally. No wheezes/rales/rhonchi. Gastrointestinal: Soft. No distention, no guarding, no rebound. Obese. Moderate tenderness in the right lower quadrant.  Genitourinary/rectal: Scant vaginal discharge, unable to reach the cervix, very high, the patient reports minimal discomfort palpation over suprapubic area. Musculoskeletal: Nontender with normal range of motion in all extremities. No joint effusions.  No  lower extremity tenderness.  No edema. Neurologic:  Normal speech and language. No gross or focal neurologic deficits are appreciated. Skin:  Skin is warm, dry and intact. No rash noted. Psychiatric: Mood and affect are normal. Speech and behavior are normal. Patient exhibits appropriate insight and judgment.   ____________________________________________  LABS (pertinent positives/negatives)  Labs Reviewed  URINALYSIS, ROUTINE W REFLEX MICROSCOPIC - Abnormal; Notable for the following:       Result Value   Color, Urine YELLOW (*)    APPearance HAZY (*)    Hgb urine dipstick MODERATE (*)    Squamous Epithelial / LPF 0-5 (*)    All other components within normal limits  CBC WITH DIFFERENTIAL/PLATELET - Abnormal; Notable for the following:    WBC 11.6 (*)    Hemoglobin 9.0 (*)    HCT 28.5 (*)    MCV 65.7 (*)    MCH 20.8 (*)    MCHC 31.7 (*)    RDW 17.8 (*)    Neutro Abs 8.8 (*)    All other components within normal limits  BASIC METABOLIC PANEL - Abnormal; Notable for the following:    Glucose, Bld 119 (*)    All other components within normal limits  CHLAMYDIA/NGC RT PCR ([facility] ONLY)  WET PREP, GENITAL  POCT PREGNANCY, URINE    ____________________________________________    EKG I, Governor Rooks, MD, the attending physician have personally viewed and interpreted all ECGs.  None ____________________________________________  RADIOLOGY All Xrays were viewed by me. Imaging interpreted by Radiologist.  CT abdomen and pelvis with contrast: Pending __________________________________________  PROCEDURES  Procedure(s) performed: None  Critical Care performed: None  ____________________________________________   ED COURSE / ASSESSMENT AND PLAN  Pertinent labs & imaging results that were available during my care of the patient were reviewed by me and considered in my medical decision making (see chart for details).   Cheryl Santana is overall  well-appearing but she does have a slightly elevated white blood cell count and focal right lower quadrant tenderness and I did discuss obtaining CT for evaluation of right lower quadrant pain.  Clinically am less suspicious for PID, I did send blood prep and chlamydia testing.  Patient care transferred to Dr. Manson Passey at shift change 11 PM. Wet prep and STD testing are pending. CT of abdomen and pelvis is pending. If CT abdomen pelvis is negative, I would consider ultrasound of the pelvis.   CONSULTATIONS:  None Patient / Family / Caregiver informed of clinical course, medical decision-making process, and agree with plan.    ___________________________________________   FINAL CLINICAL IMPRESSION(S) / ED DIAGNOSES   Final diagnoses:  Abdominal pain, RLQ              Note: This dictation was prepared with Dragon dictation. Any transcriptional errors that result from this process are unintentional  Governor Rooks, MD 01/09/16 [phone]

## 2016-01-09 NOTE — ED Triage Notes (Signed)
Pt is ambulatory to triage with c/o of left lower pelvic pain that started 2-3 days ago and has radiated to the right lower pelvis at this time. Pt denies urinary symptoms and is in NAD at this time.

## 2016-01-10 LAB — CHLAMYDIA/NGC RT PCR (ARMC ONLY)
CHLAMYDIA TR: NOT DETECTED
N GONORRHOEAE: NOT DETECTED

## 2016-01-10 MED ORDER — KETOROLAC TROMETHAMINE 30 MG/ML IJ SOLN
30.0000 mg | Freq: Once | INTRAMUSCULAR | Status: AC
  Administered 2016-01-10: 30 mg via INTRAVENOUS
  Filled 2016-01-10: qty 1

## 2016-01-10 MED ORDER — ONDANSETRON HCL 4 MG/2ML IJ SOLN
4.0000 mg | Freq: Once | INTRAMUSCULAR | Status: AC
  Administered 2016-01-10: 4 mg via INTRAVENOUS

## 2016-01-10 MED ORDER — MORPHINE SULFATE (PF) 4 MG/ML IV SOLN
4.0000 mg | Freq: Once | INTRAVENOUS | Status: DC
  Filled 2016-01-10: qty 1

## 2016-01-10 MED ORDER — ONDANSETRON HCL 4 MG/2ML IJ SOLN
INTRAMUSCULAR | Status: AC
  Filled 2016-01-10: qty 2

## 2016-01-10 MED ORDER — OXYCODONE-ACETAMINOPHEN 5-325 MG PO TABS
ORAL_TABLET | ORAL | Status: AC
  Filled 2016-01-10: qty 1

## 2016-01-10 MED ORDER — OXYCODONE-ACETAMINOPHEN 5-325 MG PO TABS
1.0000 | ORAL_TABLET | Freq: Once | ORAL | Status: AC
  Administered 2016-01-10: 1 via ORAL

## 2016-01-10 MED ORDER — ONDANSETRON 4 MG PO TBDP: 4.0000 mg | ORAL_TABLET | Freq: Three times a day (TID) | ORAL | 0 refills | Status: DC | PRN

## 2016-01-10 MED ORDER — ONDANSETRON HCL 4 MG/2ML IJ SOLN
4.0000 mg | Freq: Once | INTRAMUSCULAR | Status: AC
  Administered 2016-01-10: 4 mg via INTRAVENOUS
  Filled 2016-01-10: qty 2

## 2016-01-10 MED ORDER — OXYCODONE-ACETAMINOPHEN 5-325 MG PO TABS: 1.0000 | ORAL_TABLET | ORAL | 0 refills | Status: DC | PRN

## 2016-01-10 NOTE — ED Notes (Signed)
Report from rebecca, rn.  

## 2016-01-10 NOTE — ED Notes (Signed)
Pt verbalizes understanding not to drive after oxycodone administration. Pt states her sister in law is in the lobby with the pt's children and is driving her home.

## 2016-01-10 NOTE — ED Notes (Signed)
Pt stated she was nauseated again. nurse got verbal order for zofran.

## 2016-01-31 ENCOUNTER — Telehealth: Payer: Self-pay | Admitting: Emergency Medicine

## 2016-01-31 ENCOUNTER — Emergency Department
Admission: EM | Admit: 2016-01-31 | Discharge: 2016-01-31 | Disposition: A | Discharge status: Home / Self Care | Payer: Self-pay | Attending: Emergency Medicine | Admitting: Emergency Medicine

## 2016-01-31 ENCOUNTER — Encounter: Payer: Self-pay | Admitting: Emergency Medicine

## 2016-01-31 ENCOUNTER — Emergency Department: Payer: Self-pay

## 2016-01-31 DIAGNOSIS — I10 Essential (primary) hypertension: Secondary | ICD-10-CM | POA: Insufficient documentation

## 2016-01-31 DIAGNOSIS — Z79899 Other long term (current) drug therapy: Secondary | ICD-10-CM | POA: Insufficient documentation

## 2016-01-31 DIAGNOSIS — N200 Calculus of kidney: Secondary | ICD-10-CM | POA: Insufficient documentation

## 2016-01-31 LAB — URINALYSIS, COMPLETE (UACMP) WITH MICROSCOPIC
Bacteria, UA: NONE SEEN
Bilirubin Urine: NEGATIVE
GLUCOSE, UA: NEGATIVE mg/dL
Ketones, ur: NEGATIVE mg/dL
Nitrite: NEGATIVE
PH: 6 (ref 5.0–8.0)
PROTEIN: 30 mg/dL — AB
Specific Gravity, Urine: 1.016 (ref 1.005–1.030)

## 2016-01-31 LAB — CBC
HEMATOCRIT: 26.9 % — AB (ref 35.0–47.0)
Hemoglobin: 8.6 g/dL — ABNORMAL LOW (ref 12.0–16.0)
MCH: 20.5 pg — AB (ref 26.0–34.0)
MCHC: 31.9 g/dL — AB (ref 32.0–36.0)
MCV: 64.3 fL — AB (ref 80.0–100.0)
Platelets: 336 10*3/uL (ref 150–440)
RBC: 4.18 MIL/uL (ref 3.80–5.20)
RDW: 18.4 % — AB (ref 11.5–14.5)
WBC: 10.9 10*3/uL (ref 3.6–11.0)

## 2016-01-31 LAB — COMPREHENSIVE METABOLIC PANEL
ALBUMIN: 3.5 g/dL (ref 3.5–5.0)
ALT: 10 U/L — ABNORMAL LOW (ref 14–54)
AST: 20 U/L (ref 15–41)
Alkaline Phosphatase: 65 U/L (ref 38–126)
Anion gap: 6 (ref 5–15)
BILIRUBIN TOTAL: 0.3 mg/dL (ref 0.3–1.2)
BUN: 16 mg/dL (ref 6–20)
CO2: 25 mmol/L (ref 22–32)
Calcium: 8.3 mg/dL — ABNORMAL LOW (ref 8.9–10.3)
Chloride: 106 mmol/L (ref 101–111)
Creatinine, Ser: 0.9 mg/dL (ref 0.44–1.00)
GFR calc Af Amer: >60 mL/min (ref >60)
GFR calc non Af Amer: >60 mL/min (ref >60)
GLUCOSE: 119 mg/dL — AB (ref 65–99)
POTASSIUM: 3.6 mmol/L (ref 3.5–5.1)
SODIUM: 137 mmol/L (ref 135–145)
TOTAL PROTEIN: 7.5 g/dL (ref 6.5–8.1)

## 2016-01-31 LAB — LIPASE, BLOOD: Lipase: 20 U/L (ref 11–51)

## 2016-01-31 MED ORDER — MORPHINE SULFATE (PF) 4 MG/ML IV SOLN
4.0000 mg | Freq: Once | INTRAVENOUS | Status: AC
  Administered 2016-01-31: 4 mg via INTRAVENOUS

## 2016-01-31 MED ORDER — OXYCODONE-ACETAMINOPHEN 5-325 MG PO TABS
2.0000 | ORAL_TABLET | Freq: Once | ORAL | Status: AC
  Administered 2016-01-31: 2 via ORAL
  Filled 2016-01-31: qty 2

## 2016-01-31 MED ORDER — OXYCODONE-ACETAMINOPHEN 5-325 MG PO TABS: 1.0000 | ORAL_TABLET | Freq: Four times a day (QID) | ORAL | 0 refills | Status: DC | PRN

## 2016-01-31 MED ORDER — SODIUM CHLORIDE 0.9 % IV BOLUS (SEPSIS)
1000.0000 mL | Freq: Once | INTRAVENOUS | Status: AC
  Administered 2016-01-31: 1000 mL via INTRAVENOUS

## 2016-01-31 MED ORDER — ONDANSETRON HCL 4 MG/2ML IJ SOLN
INTRAMUSCULAR | Status: AC
  Administered 2016-01-31: 4 mg via INTRAVENOUS
  Filled 2016-01-31: qty 2

## 2016-01-31 MED ORDER — ONDANSETRON 4 MG PO TBDP: 4.0000 mg | ORAL_TABLET | Freq: Three times a day (TID) | ORAL | 0 refills | Status: DC | PRN

## 2016-01-31 MED ORDER — ONDANSETRON HCL 4 MG/2ML IJ SOLN: 4.0000 mg | Freq: Once | INTRAMUSCULAR | Status: DC

## 2016-01-31 MED ORDER — ONDANSETRON HCL 4 MG/2ML IJ SOLN
4.0000 mg | Freq: Once | INTRAMUSCULAR | Status: AC | PRN
  Administered 2016-01-31: 4 mg via INTRAVENOUS

## 2016-01-31 NOTE — ED Notes (Signed)
Patient is complaining of left lower quadrant pain and vomiting.  Patient reports history of right sided kidney stones and reports similar symptoms.

## 2016-01-31 NOTE — ED Notes (Signed)
AAOx3.  Skin warm and dry.  NAD 

## 2016-01-31 NOTE — ED Provider Notes (Signed)
[facility] Emergency Department Provider Note    First MD Initiated Contact with Patient 01/31/16 1127     (approximate)  I have reviewed the triage vital signs and the nursing notes.   HISTORY  Chief Complaint Flank Pain   HPI Cheryl Santana is a 36 y.o. female presents with abrupt onset of 10 out of 10 sharp left flank pain this morning accompanied by nausea and vomiting. Patient denies any diarrhea no constipation. Patient denies any fever or febrile on presentation temperature 90.8. Of note patient was diagnosed with a 4 mm right UVJ stone in December.   Past Medical History:  Diagnosis Date  . Anemia   . Arthritis   . Collagen vascular disease (HCC)    lupus  . Hypertension   . Pancreatitis     Patient Active Problem List   Diagnosis Date Noted  . Iron deficiency anemia 07/01/2014    Past Surgical History:  Procedure Laterality Date  . c-section    . CESAREAN SECTION    . CHOLECYSTECTOMY      Prior to Admission medications   Medication Sig Start Date End Date Taking? Authorizing Provider  hydrochlorothiazide (HYDRODIURIL) 25 MG tablet Take 25 mg by mouth daily.    Historical Provider, MD  naproxen (NAPROSYN) 500 MG tablet Take 1 tablet (500 mg total) by mouth 2 (two) times daily with a meal. 09/27/14   Sharman Cheek, MD  ondansetron (ZOFRAN ODT) 4 MG disintegrating tablet Take 1 tablet (4 mg total) by mouth every 8 (eight) hours as needed for nausea or vomiting. 01/10/16   Darci Current, MD  oxyCODONE-acetaminophen (ROXICET) 5-325 MG tablet Take 1 tablet by mouth every 4 (four) hours as needed for severe pain. 01/10/16   Darci Current, MD  traMADol (ULTRAM) 50 MG tablet Take 1 tablet (50 mg total) by mouth every 6 (six) hours as needed. 12/11/15 12/10/16  Darci Current, MD    Allergies Patient has no known allergies.  Family History  Problem Relation Age of Onset  . Anemia Mother   . Hypertension Mother   . Anemia Paternal  Aunt   . Anemia Cousin   . Hypertension Father   . Cancer Other     great aunt- colon cancer    Social History Social History  Substance Use Topics  . Smoking status: Never Smoker  . Smokeless tobacco: Never Used  . Alcohol use No    Review of Systems Constitutional: No fever/chills Eyes: No visual changes. ENT: No sore throat. Cardiovascular: Denies chest pain. Respiratory: Denies shortness of breath. Gastrointestinal: Positive for left flank pain and vomiting  Genitourinary: Negative for dysuria. Musculoskeletal: Negative for back pain. Skin: Negative for rash. Neurological: Negative for headaches, focal weakness or numbness.  10-point ROS otherwise negative.  ____________________________________________   PHYSICAL EXAM:  VITAL SIGNS: ED Triage Vitals  Enc Vitals Group     BP 01/31/16 1039 (!) 143/100     Pulse Rate 01/31/16 1039 89     Resp 01/31/16 1039 16     Temp 01/31/16 1039 98 F (36.7 C)     Temp Source 01/31/16 1039 Oral     SpO2 01/31/16 1039 100 %     Weight 01/31/16 1040 270 lb (122.5 kg)     Height 01/31/16 1040 5\' 7"  (1.702 m)     Head Circumference --      Peak Flow --      Pain Score 01/31/16 1053 10  Pain Loc --      Pain Edu? --      Excl. in GC? --     Constitutional: Alert and oriented. Apparent discomfort Eyes: Conjunctivae are normal. PERRL. EOMI. Head: Atraumatic. Mouth/Throat: Mucous membranes are moist.  Oropharynx non-erythematous. Neck: No stridor.  No meningeal signs. Cardiovascular: Normal rate, regular rhythm. Good peripheral circulation. Grossly normal heart sounds. Respiratory: Normal respiratory effort.  No retractions. Lungs CTAB. Gastrointestinal: Soft and nontender. No distention.  Musculoskeletal: No lower extremity tenderness nor edema. No gross deformities of extremities. Neurologic:  Normal speech and language. No gross focal neurologic deficits are appreciated.  Skin:  Skin is warm, dry and intact. No rash  noted.   ____________________________________________   LABS (all labs ordered are listed, but only abnormal results are displayed)  Labs Reviewed  URINALYSIS, COMPLETE (UACMP) WITH MICROSCOPIC - Abnormal; Notable for the following:       Result Value   Color, Urine YELLOW (*)    APPearance HAZY (*)    Hgb urine dipstick LARGE (*)    Protein, ur 30 (*)    Leukocytes, UA TRACE (*)    Squamous Epithelial / LPF 0-5 (*)    All other components within normal limits  COMPREHENSIVE METABOLIC PANEL - Abnormal; Notable for the following:    Glucose, Bld 119 (*)    Calcium 8.3 (*)    ALT 10 (*)    All other components within normal limits  CBC - Abnormal; Notable for the following:    Hemoglobin 8.6 (*)    HCT 26.9 (*)    MCV 64.3 (*)    MCH 20.5 (*)    MCHC 31.9 (*)    RDW 18.4 (*)    All other components within normal limits  LIPASE, BLOOD    RADIOLOGY I, [place] N Falana Clagg, personally viewed and evaluated these images (plain radiographs) as part of my medical decision making, as well as reviewing the written report by the radiologist.  Ct Renal Stone Study  Result Date: 01/31/2016 CLINICAL DATA:  Left lower quadrant pain, vomiting. EXAM: CT ABDOMEN AND PELVIS WITHOUT CONTRAST TECHNIQUE: Multidetector CT imaging of the abdomen and pelvis was performed following the standard protocol without IV contrast. COMPARISON:  01/10/2016 FINDINGS: Lower chest: Lung bases are clear. No effusions. Heart is normal size. Hepatobiliary: No focal liver abnormality is seen. Status post cholecystectomy. No biliary dilatation. Pancreas: No focal abnormality or ductal dilatation. Spleen: No focal abnormality.  Normal size. Adrenals/Urinary Tract: Previously seen distal right ureteral stone and right hydronephrosis have resolved. There is a proximal left ureteral stone measuring 4 mm. Mild fullness of the left renal collecting system. Perinephric stranding around the left kidney. Urinary bladder and adrenal  glands unremarkable. Stomach/Bowel: Stomach, large and small bowel grossly unremarkable. Appendix is normal. Vascular/Lymphatic: No evidence of aneurysm or adenopathy. Reproductive: Uterus and adnexa unremarkable.  No mass. Other: No free fluid or free air. Musculoskeletal: No acute bony abnormality or focal bone lesion. IMPRESSION: 4 mm proximal left ureteral stone with mild fullness of the left renal collecting system and perinephric stranding. Electronically Signed   By: Charlett Nose M.D.   On: 01/31/2016 12:51     Procedures     INITIAL IMPRESSION / ASSESSMENT AND PLAN / ED COURSE  Pertinent labs & imaging results that were available during my care of the patient were reviewed by me and considered in my medical decision making (see chart for details).  36 year old female presenting with abrupt onset of left flank  pain associated with vomiting. History physical exam concerning for possible ureterolithiasis which was confirmed on CT. Received IV morphine and Zofran emergency department improvement of symptoms.       ____________________________________________  FINAL CLINICAL IMPRESSION(S) / ED DIAGNOSES  Final diagnoses:  Kidney stone on left side     MEDICATIONS GIVEN DURING THIS VISIT:  Medications  ondansetron (ZOFRAN) injection 4 mg (4 mg Intravenous Given 01/31/16 1146)  morphine 4 MG/ML injection 4 mg (4 mg Intravenous Given 01/31/16 1220)  sodium chloride 0.9 % bolus 1,000 mL (1,000 mLs Intravenous New Bag/Given 01/31/16 1220)     NEW OUTPATIENT MEDICATIONS STARTED DURING THIS VISIT:  New Prescriptions   No medications on file    Modified Medications   No medications on file    Discontinued Medications   No medications on file     Note:  This document was prepared using Dragon voice recognition software and may include unintentional dictation errors.    Darci Current, MD 01/31/16 1329

## 2016-01-31 NOTE — ED Triage Notes (Signed)
Pt presents with left side flank pain started this am. Was seen here back in December for kidney stone but not sure if passed or not.

## 2016-01-31 NOTE — Telephone Encounter (Signed)
[facility] [address] called asking for alternative to zofran due to cost issue.  Per dr brown can change to phenergan 12.5 mg po every 8 hours as needed for nausea with quantity of 15.  Also instructed pharmacy to tell patient not to take the oxy at the same time.

## 2016-04-11 ENCOUNTER — Encounter: Payer: Self-pay | Admitting: Emergency Medicine

## 2016-04-11 DIAGNOSIS — Z79899 Other long term (current) drug therapy: Secondary | ICD-10-CM | POA: Insufficient documentation

## 2016-04-11 DIAGNOSIS — G43809 Other migraine, not intractable, without status migrainosus: Secondary | ICD-10-CM | POA: Insufficient documentation

## 2016-04-11 DIAGNOSIS — I10 Essential (primary) hypertension: Secondary | ICD-10-CM | POA: Insufficient documentation

## 2016-04-11 MED ORDER — KETOROLAC TROMETHAMINE 30 MG/ML IJ SOLN
30.0000 mg | Freq: Once | INTRAMUSCULAR | Status: AC
  Administered 2016-04-12: 30 mg via INTRAVENOUS
  Filled 2016-04-11: qty 1

## 2016-04-11 MED ORDER — SODIUM CHLORIDE 0.9 % IV SOLN
INTRAVENOUS | Status: DC
  Administered 2016-04-12: 02:00:00 via INTRAVENOUS

## 2016-04-11 MED ORDER — DIPHENHYDRAMINE HCL 50 MG/ML IJ SOLN
25.0000 mg | Freq: Once | INTRAMUSCULAR | Status: AC
  Administered 2016-04-12: 25 mg via INTRAVENOUS
  Filled 2016-04-11: qty 1

## 2016-04-11 MED ORDER — METOCLOPRAMIDE HCL 5 MG/ML IJ SOLN
10.0000 mg | Freq: Once | INTRAMUSCULAR | Status: AC
  Administered 2016-04-12: 10 mg via INTRAVENOUS
  Filled 2016-04-11: qty 2

## 2016-04-11 NOTE — ED Triage Notes (Signed)
Pt arrived to the ED accompanied by her mother for complaints of severe headache unrelieved by hydrocodone. Pt states that had left over medication from her previous migraine and took it but it was unsuccessful. Pt also reports to out of her blood pressure medication. Pt is AOx4 in no apparent distress playing with her phone during triage.

## 2016-04-12 ENCOUNTER — Emergency Department
Admission: EM | Admit: 2016-04-12 | Discharge: 2016-04-12 | Disposition: A | Discharge status: Home / Self Care | Payer: Medicaid Other | Attending: Emergency Medicine | Admitting: Emergency Medicine

## 2016-04-12 DIAGNOSIS — G43809 Other migraine, not intractable, without status migrainosus: Secondary | ICD-10-CM

## 2016-04-12 MED ORDER — HYDROCHLOROTHIAZIDE 25 MG PO TABS: 25.0000 mg | ORAL_TABLET | Freq: Every day | ORAL | 0 refills | Status: AC

## 2016-04-12 MED ORDER — MORPHINE SULFATE (PF) 2 MG/ML IV SOLN
INTRAVENOUS | Status: AC
  Administered 2016-04-12: 2 mg via INTRAVENOUS
  Filled 2016-04-12: qty 1

## 2016-04-12 MED ORDER — MORPHINE SULFATE (PF) 4 MG/ML IV SOLN: 2.0000 mg | Freq: Once | INTRAVENOUS | Status: DC

## 2016-04-12 MED ORDER — KETOROLAC TROMETHAMINE 10 MG PO TABS: 10.0000 mg | ORAL_TABLET | Freq: Four times a day (QID) | ORAL | 0 refills | Status: DC | PRN

## 2016-04-12 NOTE — ED Provider Notes (Signed)
[facility] Emergency Department Provider Note    First MD Initiated Contact with Patient 04/12/16 [phone]     (approximate)  I have reviewed the triage vital signs and the nursing notes.   HISTORY  Chief Complaint Migraine    HPI Cheryl Santana is a 36 y.o. female with local list of chronic medical conditions including migraine headaches presents to the emergency department with a migraine headache which patient states his been generalized and has been occurring for approximately 1 day unrelieved with hydrocodone at home. Patient states her pain was 10 out of 10 on arrival to the emergency department but since intervention her pain is currently 5 out of 10. Patient states current headache is consistent with previous migraine headaches. She denies any nausea vomiting weakness gait instability or visual changes.   Past Medical History:  Diagnosis Date  . Anemia   . Arthritis   . Collagen vascular disease (HCC)    lupus  . Hypertension   . Pancreatitis     Patient Active Problem List   Diagnosis Date Noted  . Iron deficiency anemia 07/01/2014    Past Surgical History:  Procedure Laterality Date  . c-section    . CESAREAN SECTION    . CHOLECYSTECTOMY      Prior to Admission medications   Medication Sig Start Date End Date Taking? Authorizing Provider  hydrochlorothiazide (HYDRODIURIL) 25 MG tablet Take 25 mg by mouth daily.    Historical Provider, MD  naproxen (NAPROSYN) 500 MG tablet Take 1 tablet (500 mg total) by mouth 2 (two) times daily with a meal. 09/27/14   Sharman Cheek, MD  ondansetron (ZOFRAN ODT) 4 MG disintegrating tablet Take 1 tablet (4 mg total) by mouth every 8 (eight) hours as needed for nausea or vomiting. 01/10/16   Darci Current, MD  ondansetron (ZOFRAN ODT) 4 MG disintegrating tablet Take 1 tablet (4 mg total) by mouth every 8 (eight) hours as needed for nausea or vomiting. 01/31/16   Darci Current, MD    oxyCODONE-acetaminophen (ROXICET) 5-325 MG tablet Take 1 tablet by mouth every 4 (four) hours as needed for severe pain. 01/10/16   Darci Current, MD  oxyCODONE-acetaminophen (ROXICET) 5-325 MG tablet Take 1 tablet by mouth every 6 (six) hours as needed for severe pain. 01/31/16   Darci Current, MD  traMADol (ULTRAM) 50 MG tablet Take 1 tablet (50 mg total) by mouth every 6 (six) hours as needed. 12/11/15 12/10/16  Darci Current, MD    Allergies No known drug allergies  Family History  Problem Relation Age of Onset  . Anemia Mother   . Hypertension Mother   . Anemia Paternal Aunt   . Anemia Cousin   . Hypertension Father   . Cancer Other     great aunt- colon cancer    Social History Social History  Substance Use Topics  . Smoking status: Never Smoker  . Smokeless tobacco: Never Used  . Alcohol use No    Review of Systems Constitutional: No fever/chills Eyes: No visual changes. ENT: No sore throat. Cardiovascular: Denies chest pain. Respiratory: Denies shortness of breath. Gastrointestinal: No abdominal pain.  No nausea, no vomiting.  No diarrhea.  No constipation. Genitourinary: Negative for dysuria. Musculoskeletal: Negative for back pain. Skin: Negative for rash. Neurological: Negative for headaches, focal weakness or numbness.  10-point ROS otherwise negative.  ____________________________________________   PHYSICAL EXAM:  VITAL SIGNS: ED Triage Vitals  Enc Vitals Group  BP 04/11/16 2218 (!) 176/113     Pulse Rate 04/11/16 2218 96     Resp 04/11/16 2218 18     Temp 04/11/16 2218 99.1 F (37.3 C)     Temp Source 04/11/16 2218 Oral     SpO2 04/11/16 2218 100 %     Weight 04/11/16 2219 (!) 316 lb (143.3 kg)     Height 04/11/16 2219 5\' 7"  (1.702 m)     Head Circumference --      Peak Flow --      Pain Score 04/11/16 2218 10     Pain Loc --      Pain Edu? --      Excl. in GC? --     Constitutional: Alert and oriented. Well appearing and in no  acute distress. Eyes: Conjunctivae are normal. PERRL. EOMI. Head: Atraumatic. Mouth/Throat: Mucous membranes are moist. Oropharynx non-erythematous. Neck: No stridor.  No meningeal signs.  No cervical spine tenderness to palpation. Cardiovascular: Normal rate, regular rhythm. Good peripheral circulation. Grossly normal heart sounds. Respiratory: Normal respiratory effort.  No retractions. Lungs CTAB. Gastrointestinal: Soft and nontender. No distention.  Musculoskeletal: No lower extremity tenderness nor edema. No gross deformities of extremities. Neurologic:  Normal speech and language. No gross focal neurologic deficits are appreciated.  Skin:  Skin is warm, dry and intact. No rash noted. Psychiatric: Mood and affect are normal. Speech and behavior are normal.  __    Procedures   ____________________________________________   INITIAL IMPRESSION / ASSESSMENT AND PLAN / ED COURSE  Pertinent labs & imaging results that were available during my care of the patient were reviewed by me and considered in my medical decision making (see chart for details).  Patient states headache is consistent with previous migraine headaches no focal neurological deficits on exam and a such further imaging was not performed. Patient given Toradol Benadryl and Reglan with improvement of pain current pain score 5 out of 10.     ____________________________________________  FINAL CLINICAL IMPRESSION(S) / ED DIAGNOSES  Final diagnoses:  Other migraine without status migrainosus, not intractable     MEDICATIONS GIVEN DURING THIS VISIT:  Medications  0.9 %  sodium chloride infusion ( Intravenous New Bag/Given 04/12/16 0146)  diphenhydrAMINE (BENADRYL) injection 25 mg (25 mg Intravenous Given 04/12/16 0146)  ketorolac (TORADOL) 30 MG/ML injection 30 mg (30 mg Intravenous Given 04/12/16 0154)  metoCLOPramide (REGLAN) injection 10 mg (10 mg Intravenous Given 04/12/16 0146)     NEW OUTPATIENT MEDICATIONS  STARTED DURING THIS VISIT:  New Prescriptions   No medications on file    Modified Medications   No medications on file    Discontinued Medications   No medications on file     Note:  This document was prepared using Dragon voice recognition software and may include unintentional dictation errors.    06/12/16, MD 04/12/16 [phone]

## 2016-08-02 ENCOUNTER — Ambulatory Visit: Payer: Medicaid Other | Admitting: Hematology and Oncology

## 2016-08-06 ENCOUNTER — Other Ambulatory Visit: Payer: Self-pay | Admitting: Hematology and Oncology

## 2016-08-06 NOTE — Progress Notes (Signed)
[facility] day:  08/07/2016  Chief Complaint: Cheryl Santana is a 36 y.o. female with iron deficiency anemia who is seen for 1 year reassessment and reinitiation of IV iron.  HPI:  The patient notes a history of anemia "my whole life". She states that it "runs in the family" (both parents). She states that both her mother and herself had to have a transfusion after delivery. Her paternal aunt also required a blood transfusion.  She has heavy menses.  Her tubes were tied in 2012.  She will typically go through a pack of pads or tampons (24) in 2 days. Menses typically last 5 days ; every day is heavy.   At times, she will become lightheaded. She plans to see gynecology next week for an endometrial ablation (not been scheduled). She denies any other bruising or bleeding.  She denies any melena, hematochezia or hematuria.  Her diet is "not the healthiest".   She eats meat daily. She has some vegetables. She has had ice pica since the last trimester with her daughter (2011).  She previously went to [facility] for sheets of ice.  She has recently been going to [employer] as it is closer to get ice.  She craves the smell of things.  Oral iron causes constipation.  She has a history of recurrent pancreatitis.  She underwent endoscopy in 03/2014.  Biopsies were negative.  She has had no recent episodes.  She had 2 C-sections and a cholecystectomy without bleeding issues.  She has a history of microcytic anemia dating back to at least 11/19/2013.  At that time, hematocrit was 32, hemoglobin 9.6 and MCV 68.  Labs on 07/21/2016 revealed a hematocrit 30.5, hemoglobin 8.6, MCV 67, and white count 9300. Platelet count appeared to be adequate but was clumped.  Ferritin was 5 on 12/02/2013, 8 on 09/14/2015 and 6 on 07/21/2016. Iron saturation has ranged between 2 and 5%.  B12 was 410 on 05/18/2015.  She was last seen in the hematology clinic on 07/01/2014.  CBC revealed a  hematocrit of 32, hemoglobin 9.2, and MCV 71.  Iron studies revealed an iron saturation of 3% and a TIBC of 395.  She was scheduled to receive Venofer x 4 then follow-up in clinic.  She received Venofer x 1 on 08/20/2014.  Symptomatically, she notes shortness of breath and fatigue.  Weight is up.  She has heavy menses.  She denies any melena or hematochezia.   Past Medical History:  Diagnosis Date  . Anemia   . Arthritis   . Collagen vascular disease (HCC)    lupus  . Hypertension   . Pancreatitis     Past Surgical History:  Procedure Laterality Date  . c-section    . CESAREAN SECTION    . CHOLECYSTECTOMY      Family History  Problem Relation Age of Onset  . Anemia Mother   . Hypertension Mother   . Anemia Paternal Aunt   . Anemia Cousin   . Hypertension Father   . Cancer Other        great aunt- colon cancer    Social History:  reports that she has never smoked. She has never used smokeless tobacco. She reports that she does not drink alcohol or use drugs.  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 The patient is alone today.  Allergies:  Allergies  Allergen  Reactions  . Hydroxychloroquine Other (See Comments)    Current Medications: Current Outpatient Prescriptions  Medication Sig Dispense Refill  . cyclobenzaprine (FLEXERIL) 10 MG tablet Take 10 mg by mouth at bedtime.    . hydrochlorothiazide (HYDRODIURIL) 25 MG tablet Take 1 tablet (25 mg total) by mouth daily. 30 tablet 0  . meloxicam (MOBIC) 7.5 MG tablet Take 7.5 mg by mouth daily.  1  . fluconazole (DIFLUCAN) 150 MG tablet Take 150 mg by mouth as needed.     No current facility-administered medications for this visit.     Review of Systems:  GENERAL:  Fatigue.  No fevers or sweats.  Weight gain. PERFORMANCE STATUS (ECOG): 1 HEENT:  No visual changes, runny nose, sore throat, mouth sores or tenderness. Lungs: No shortness of breath or  cough.  No hemoptysis. Cardiac:  No chest pain, palpitations, orthopnea, or PND. GI:  Ice pica.  No nausea, vomiting, diarrhea, constipation, melena or hematochezia. GU:  Heavy menses.  No urgency, frequency, dysuria, or hematuria. Musculoskeletal:  No back pain.  No joint pain.  No muscle tenderness. Extremities:  No pain or swelling. Skin:  No rashes or skin changes.  Diagnosed with lupus last year. Neuro:  No headache, numbness or weakness, balance or coordination issues. Endocrine:  No diabetes, thyroid issues, hot flashes or night sweats. Psych:  No mood changes, depression or anxiety. Pain:  No focal pain. Review of systems:  All other systems reviewed and found to be negative.  Physical Exam: Blood pressure (!) 157/100, pulse 91, temperature (!) 97.5 F (36.4 C), temperature source Tympanic, resp. rate 20, weight (!) 330 lb 2 oz (149.7 kg). GENERAL:  Well developed, well nourished, overweight woman sitting comfortably in the exam room in no acute distress. MENTAL STATUS:  Alert and oriented to person, place and time. HEAD:  Short dark hair.  Normocephalic, atraumatic, face symmetric, no Cushingoid features. EYES:  Brown eyes.  Pupils equal round and reactive to light and accomodation.  No conjunctivitis or scleral icterus. ENT:  Oropharynx clear without lesion.  Tongue normal. Mucous membranes moist.  RESPIRATORY:  Clear to auscultation without rales, wheezes or rhonchi. CARDIOVASCULAR:  Regular rate and rhythm without murmur, rub or gallop. ABDOMEN:  Soft, non-tender, with active bowel sounds, and no appreciable hepatosplenomegaly.  No masses. SKIN:  No rashes, ulcers or lesions. EXTREMITIES: No edema, no skin discoloration or tenderness.  No palpable cords. LYMPH NODES: No palpable cervical, supraclavicular, axillary or inguinal adenopathy  NEUROLOGICAL: Unremarkable. PSYCH:  Appropriate.   Appointment on 08/07/2016  Component Date Value Ref Range Status  . WBC 08/07/2016  8.8  3.6 - 11.0 K/uL Final  . RBC 08/07/2016 4.44  3.80 - 5.20 MIL/uL Final  . Hemoglobin 08/07/2016 8.5* 12.0 - 16.0 g/dL Final  . HCT 08/07/2016 27.1* 35.0 - 47.0 % Final  . MCV 08/07/2016 60.9* 80.0 - 100.0 fL Final  . MCH 08/07/2016 19.2* 26.0 - 34.0 pg Final  . MCHC 08/07/2016 31.6* 32.0 - 36.0 g/dL Final  . RDW 08/07/2016 20.4* 11.5 - 14.5 % Final  . Platelets 08/07/2016 336  150 - 440 K/uL Final  . Neutrophils Relative % 08/07/2016 67  % Final  . Neutro Abs 08/07/2016 5.9  1.4 - 6.5 K/uL Final  . Lymphocytes Relative 08/07/2016 23  % Final  . Lymphs Abs 08/07/2016 2.0  1.0 - 3.6 K/uL Final  . Monocytes Relative 08/07/2016 8  % Final  . Monocytes Absolute 08/07/2016 0.7  0.2 - 0.9 K/uL Final  .  Eosinophils Relative 08/07/2016 2  % Final  . Eosinophils Absolute 08/07/2016 0.2  0 - 0.7 K/uL Final  . Basophils Relative 08/07/2016 0  % Final  . Basophils Absolute 08/07/2016 0.0  0 - 0.1 K/uL Final  . Ferritin 08/07/2016 5* 11 - 307 ng/mL Final  . Sodium 08/07/2016 134* 135 - 145 mmol/L Final  . Potassium 08/07/2016 4.0  3.5 - 5.1 mmol/L Final  . Chloride 08/07/2016 104  101 - 111 mmol/L Final  . CO2 08/07/2016 25  22 - 32 mmol/L Final  . Glucose, Bld 08/07/2016 94  65 - 99 mg/dL Final  . BUN 08/07/2016 11  6 - 20 mg/dL Final  . Creatinine, Ser 08/07/2016 0.52  0.44 - 1.00 mg/dL Final  . Calcium 08/07/2016 8.6* 8.9 - 10.3 mg/dL Final  . Total Protein 08/07/2016 7.4  6.5 - 8.1 g/dL Final  . Albumin 08/07/2016 3.2* 3.5 - 5.0 g/dL Final  . AST 08/07/2016 13* 15 - 41 U/L Final  . ALT 08/07/2016 13* 14 - 54 U/L Final  . Alkaline Phosphatase 08/07/2016 67  38 - 126 U/L Final  . Total Bilirubin 08/07/2016 0.4  0.3 - 1.2 mg/dL Final  . GFR calc non Af Amer 08/07/2016 >60  >60 mL/min Final  . GFR calc Af Amer 08/07/2016 >60  >60 mL/min Final   Comment: (NOTE) The eGFR has been calculated using the CKD EPI equation. This calculation has not been validated in all clinical  situations. eGFR's persistently <60 mL/min signify possible Chronic Kidney Disease.   . Anion gap 08/07/2016 5  5 - 15 Final    Assessment:  NAIYAH KLOSTERMANN is a 36 y.o. female with iron deficiency anemia secondary to heavy menses.  She denies any melena or hematochezia.  Diet is good.  She has ice pica.  She is intolerant of oral iron (causes constipation).  Ferritin has been followed:  5 on 12/02/2013, 8 on 09/14/2015 and 6 on 07/21/2016.   She received Venofer 200 mg x 1 (08/20/2014).  She denies any excess bruising.  She has had 2 C-sections and a cholecystectomy without excess bleeding.   Symptomatically, she is fatigued.  Exam is unremarkable.  Plan: 1.  Review diagnosis of iron deficiency anemia.  Discuss heavy menses.  Patient plans to see gynecology for an endometrial ablation. 2.  Discuss reinitiation of IV iron.  Discuss Venofer or Feraheme.  Patient would like to restart Venofer. 3.  Labs today:  CBC with diff, CMP, ferritin. 4.  Preauth Venofer.   5.  Follow-up with gynecology. 6.  RTC weekly x 4 on Mondays for Venofer. 7.  RTC in 6 weeks for MD assess and labs (CBC with diff, ferritin).   Lequita Asal, MD  08/07/2016, 6:57 PM

## 2016-08-07 ENCOUNTER — Encounter: Payer: Self-pay | Admitting: Hematology and Oncology

## 2016-08-07 ENCOUNTER — Inpatient Hospital Stay: Payer: 59 | Attending: Hematology and Oncology | Admitting: Hematology and Oncology

## 2016-08-07 ENCOUNTER — Inpatient Hospital Stay: Payer: 59

## 2016-08-07 VITALS — BP 157/100 | HR 91 | Temp 97.5°F | Resp 20 | Wt 330.1 lb

## 2016-08-07 DIAGNOSIS — D509 Iron deficiency anemia, unspecified: Secondary | ICD-10-CM

## 2016-08-07 DIAGNOSIS — M129 Arthropathy, unspecified: Secondary | ICD-10-CM | POA: Insufficient documentation

## 2016-08-07 DIAGNOSIS — R0602 Shortness of breath: Secondary | ICD-10-CM | POA: Insufficient documentation

## 2016-08-07 DIAGNOSIS — Z79899 Other long term (current) drug therapy: Secondary | ICD-10-CM | POA: Diagnosis not present

## 2016-08-07 DIAGNOSIS — N920 Excessive and frequent menstruation with regular cycle: Secondary | ICD-10-CM | POA: Diagnosis not present

## 2016-08-07 DIAGNOSIS — I899 Noninfective disorder of lymphatic vessels and lymph nodes, unspecified: Secondary | ICD-10-CM | POA: Diagnosis not present

## 2016-08-07 DIAGNOSIS — Z8719 Personal history of other diseases of the digestive system: Secondary | ICD-10-CM | POA: Diagnosis not present

## 2016-08-07 DIAGNOSIS — D500 Iron deficiency anemia secondary to blood loss (chronic): Secondary | ICD-10-CM

## 2016-08-07 DIAGNOSIS — M329 Systemic lupus erythematosus, unspecified: Secondary | ICD-10-CM | POA: Insufficient documentation

## 2016-08-07 DIAGNOSIS — Z9049 Acquired absence of other specified parts of digestive tract: Secondary | ICD-10-CM | POA: Insufficient documentation

## 2016-08-07 LAB — CBC WITH DIFFERENTIAL/PLATELET
Basophils Absolute: 0 10*3/uL (ref 0–0.1)
Basophils Relative: 0 %
Eosinophils Absolute: 0.2 10*3/uL (ref 0–0.7)
Eosinophils Relative: 2 %
HCT: 27.1 % — ABNORMAL LOW (ref 35.0–47.0)
Hemoglobin: 8.5 g/dL — ABNORMAL LOW (ref 12.0–16.0)
Lymphocytes Relative: 23 %
Lymphs Abs: 2 10*3/uL (ref 1.0–3.6)
MCH: 19.2 pg — ABNORMAL LOW (ref 26.0–34.0)
MCHC: 31.6 g/dL — ABNORMAL LOW (ref 32.0–36.0)
MCV: 60.9 fL — ABNORMAL LOW (ref 80.0–100.0)
Monocytes Absolute: 0.7 10*3/uL (ref 0.2–0.9)
Monocytes Relative: 8 %
Neutro Abs: 5.9 10*3/uL (ref 1.4–6.5)
Neutrophils Relative %: 67 %
Platelets: 336 10*3/uL (ref 150–440)
RBC: 4.44 MIL/uL (ref 3.80–5.20)
RDW: 20.4 % — ABNORMAL HIGH (ref 11.5–14.5)
WBC: 8.8 10*3/uL (ref 3.6–11.0)

## 2016-08-07 LAB — COMPREHENSIVE METABOLIC PANEL
ALT: 13 U/L — ABNORMAL LOW (ref 14–54)
AST: 13 U/L — ABNORMAL LOW (ref 15–41)
Albumin: 3.2 g/dL — ABNORMAL LOW (ref 3.5–5.0)
Alkaline Phosphatase: 67 U/L (ref 38–126)
Anion gap: 5 (ref 5–15)
BUN: 11 mg/dL (ref 6–20)
CO2: 25 mmol/L (ref 22–32)
Calcium: 8.6 mg/dL — ABNORMAL LOW (ref 8.9–10.3)
Chloride: 104 mmol/L (ref 101–111)
Creatinine, Ser: 0.52 mg/dL (ref 0.44–1.00)
GFR calc Af Amer: >60 mL/min (ref >60)
GFR calc non Af Amer: >60 mL/min (ref >60)
Glucose, Bld: 94 mg/dL (ref 65–99)
Potassium: 4 mmol/L (ref 3.5–5.1)
Sodium: 134 mmol/L — ABNORMAL LOW (ref 135–145)
Total Bilirubin: 0.4 mg/dL (ref 0.3–1.2)
Total Protein: 7.4 g/dL (ref 6.5–8.1)

## 2016-08-07 LAB — FERRITIN: Ferritin: 5 ng/mL — ABNORMAL LOW (ref 11–307)

## 2016-08-07 NOTE — Progress Notes (Signed)
Patient is seeing a rheumatologist.  Offers no complaints today.  Patient BP elevated today 159/115  HR 92.  Patient states she is under a lot of stress and is having RA pain.

## 2016-08-14 ENCOUNTER — Ambulatory Visit: Payer: 59

## 2016-08-21 ENCOUNTER — Inpatient Hospital Stay: Payer: 59 | Attending: Hematology and Oncology

## 2016-08-21 ENCOUNTER — Ambulatory Visit: Payer: 59

## 2016-08-21 ENCOUNTER — Other Ambulatory Visit: Payer: Self-pay | Admitting: Hematology and Oncology

## 2016-08-28 ENCOUNTER — Inpatient Hospital Stay: Payer: 59

## 2016-08-28 ENCOUNTER — Ambulatory Visit: Payer: 59

## 2016-08-29 ENCOUNTER — Other Ambulatory Visit: Payer: Self-pay | Admitting: *Deleted

## 2016-08-29 DIAGNOSIS — D500 Iron deficiency anemia secondary to blood loss (chronic): Secondary | ICD-10-CM

## 2016-09-04 ENCOUNTER — Ambulatory Visit: Payer: 59

## 2016-09-04 ENCOUNTER — Inpatient Hospital Stay: Payer: 59

## 2016-09-05 ENCOUNTER — Inpatient Hospital Stay: Payer: 59

## 2016-09-17 ENCOUNTER — Other Ambulatory Visit: Payer: Self-pay | Admitting: Hematology and Oncology

## 2016-09-17 DIAGNOSIS — D500 Iron deficiency anemia secondary to blood loss (chronic): Secondary | ICD-10-CM

## 2016-09-17 NOTE — Progress Notes (Deleted)
[facility] day:  09/17/2016  Chief Complaint: Cheryl Santana is a 37 y.o. female with iron deficiency anemia who is seen for 6 week reassessment after reinitiation of IV iron.  HPI:  The patient was last seen in the medical oncology clinic on 08/07/2016.  At that time, she was seen for reassessment (previously seen 07/01/2014).  She was fatigued.  Anemia was felt secondary to heavy menses.  She was scheduled to follow-up with gynecology.  CBC revealed a hematocrit of 27.1, hemoglobin 8.5, and MCV 60.9.  Ferritin was 5.  We discussed Venofer weekly x 4.  She has not received Venofer.  Appointments were cancelled.  During the interim,   Past Medical History:  Diagnosis Date  . Anemia   . Arthritis   . Collagen vascular disease (HCC)    lupus  . Hypertension   . Pancreatitis     Past Surgical History:  Procedure Laterality Date  . c-section    . CESAREAN SECTION    . CHOLECYSTECTOMY      Family History  Problem Relation Age of Onset  . Anemia Mother   . Hypertension Mother   . Anemia Paternal Aunt   . Anemia Cousin   . Hypertension Father   . Cancer Other        great aunt- colon cancer    Social History:  reports that she has never smoked. She has never used smokeless tobacco. She reports that she does not drink alcohol or use drugs.  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 The patient is alone today.  Allergies:  Allergies  Allergen Reactions  . Hydroxychloroquine Other (See Comments)    Current Medications: Current Outpatient Prescriptions  Medication Sig Dispense Refill  . fluconazole (DIFLUCAN) 150 MG tablet Take 150 mg by mouth as needed.    . hydrochlorothiazide (HYDRODIURIL) 25 MG tablet Take 1 tablet (25 mg total) by mouth daily. 30 tablet 0  . meloxicam (MOBIC) 7.5 MG tablet Take 7.5 mg by mouth daily.  1   No current facility-administered  medications for this visit.     Review of Systems:  GENERAL:  Fatigue.  No fevers or sweats.  Weight gain. PERFORMANCE STATUS (ECOG): 1 HEENT:  No visual changes, runny nose, sore throat, mouth sores or tenderness. Lungs: No shortness of breath or cough.  No hemoptysis. Cardiac:  No chest pain, palpitations, orthopnea, or PND. GI:  Ice pica.  No nausea, vomiting, diarrhea, constipation, melena or hematochezia. GU:  Heavy menses.  No urgency, frequency, dysuria, or hematuria. Musculoskeletal:  No back pain.  No joint pain.  No muscle tenderness. Extremities:  No pain or swelling. Skin:  No rashes or skin changes.  Diagnosed with lupus last year. Neuro:  No headache, numbness or weakness, balance or coordination issues. Endocrine:  No diabetes, thyroid issues, hot flashes or night sweats. Psych:  No mood changes, depression or anxiety. Pain:  No focal pain. Review of systems:  All other systems reviewed and found to be negative.  Physical Exam: There were no vitals taken for this visit. GENERAL:  Well developed, well nourished, overweight woman sitting comfortably in the exam room in no acute distress. MENTAL STATUS:  Alert and oriented to person, place and time. HEAD:  Short dark hair.  Normocephalic, atraumatic, face symmetric, no Cushingoid features. EYES:  Brown eyes.  Pupils equal round and reactive to light  and accomodation.  No conjunctivitis or scleral icterus. ENT:  Oropharynx clear without lesion.  Tongue normal. Mucous membranes moist.  RESPIRATORY:  Clear to auscultation without rales, wheezes or rhonchi. CARDIOVASCULAR:  Regular rate and rhythm without murmur, rub or gallop. ABDOMEN:  Soft, non-tender, with active bowel sounds, and no appreciable hepatosplenomegaly.  No masses. SKIN:  No rashes, ulcers or lesions. EXTREMITIES: No edema, no skin discoloration or tenderness.  No palpable cords. LYMPH NODES: No palpable cervical, supraclavicular, axillary or inguinal  adenopathy  NEUROLOGICAL: Unremarkable. PSYCH:  Appropriate.   No visits with results within 3 Day(s) from this visit.  Latest known visit with results is:  Appointment on 08/07/2016  Component Date Value Ref Range Status  . WBC 08/07/2016 8.8  3.6 - 11.0 K/uL Final  . RBC 08/07/2016 4.44  3.80 - 5.20 MIL/uL Final  . Hemoglobin 08/07/2016 8.5* 12.0 - 16.0 g/dL Final  . HCT 08/07/2016 27.1* 35.0 - 47.0 % Final  . MCV 08/07/2016 60.9* 80.0 - 100.0 fL Final  . MCH 08/07/2016 19.2* 26.0 - 34.0 pg Final  . MCHC 08/07/2016 31.6* 32.0 - 36.0 g/dL Final  . RDW 08/07/2016 20.4* 11.5 - 14.5 % Final  . Platelets 08/07/2016 336  150 - 440 K/uL Final  . Neutrophils Relative % 08/07/2016 67  % Final  . Neutro Abs 08/07/2016 5.9  1.4 - 6.5 K/uL Final  . Lymphocytes Relative 08/07/2016 23  % Final  . Lymphs Abs 08/07/2016 2.0  1.0 - 3.6 K/uL Final  . Monocytes Relative 08/07/2016 8  % Final  . Monocytes Absolute 08/07/2016 0.7  0.2 - 0.9 K/uL Final  . Eosinophils Relative 08/07/2016 2  % Final  . Eosinophils Absolute 08/07/2016 0.2  0 - 0.7 K/uL Final  . Basophils Relative 08/07/2016 0  % Final  . Basophils Absolute 08/07/2016 0.0  0 - 0.1 K/uL Final  . Ferritin 08/07/2016 5* 11 - 307 ng/mL Final  . Sodium 08/07/2016 134* 135 - 145 mmol/L Final  . Potassium 08/07/2016 4.0  3.5 - 5.1 mmol/L Final  . Chloride 08/07/2016 104  101 - 111 mmol/L Final  . CO2 08/07/2016 25  22 - 32 mmol/L Final  . Glucose, Bld 08/07/2016 94  65 - 99 mg/dL Final  . BUN 08/07/2016 11  6 - 20 mg/dL Final  . Creatinine, Ser 08/07/2016 0.52  0.44 - 1.00 mg/dL Final  . Calcium 08/07/2016 8.6* 8.9 - 10.3 mg/dL Final  . Total Protein 08/07/2016 7.4  6.5 - 8.1 g/dL Final  . Albumin 08/07/2016 3.2* 3.5 - 5.0 g/dL Final  . AST 08/07/2016 13* 15 - 41 U/L Final  . ALT 08/07/2016 13* 14 - 54 U/L Final  . Alkaline Phosphatase 08/07/2016 67  38 - 126 U/L Final  . Total Bilirubin 08/07/2016 0.4  0.3 - 1.2 mg/dL Final  . GFR calc  non Af Amer 08/07/2016 >60  >60 mL/min Final  . GFR calc Af Amer 08/07/2016 >60  >60 mL/min Final   Comment: (NOTE) The eGFR has been calculated using the CKD EPI equation. This calculation has not been validated in all clinical situations. eGFR's persistently <60 mL/min signify possible Chronic Kidney Disease.   . Anion gap 08/07/2016 5  5 - 15 Final    Assessment:  Cheryl Santana is a 36 y.o. female with iron deficiency anemia secondary to heavy menses.  She denies any melena or hematochezia.  Diet is good.  She has ice pica.  She is intolerant of oral  iron (causes constipation).  Ferritin has been followed:  5 on 12/02/2013, 8 on 09/14/2015, 6 on 07/21/2016, and 5 on 08/07/2016.   She received Venofer 200 mg x 1 (08/20/2014).  She denies any excess bruising.  She has had 2 C-sections and a cholecystectomy without excess bleeding.   Symptomatically, she is fatigued.  Exam is unremarkable.  Plan: 1.  Labs today:  CBC with diff, ferritin.  1.  Review diagnosis of iron deficiency anemia.  Discuss heavy menses.  Patient plans to see gynecology for an endometrial ablation. 2.  Discuss reinitiation of IV iron.  Discuss Venofer or Feraheme.  Patient would like to restart Venofer. 3.  Labs today:  CBC with diff, CMP, ferritin. 4.  Preauth Venofer.   5.  Follow-up with gynecology. 6.  RTC weekly x 4 on Mondays for Venofer. 7.  RTC in 6 weeks for MD assess and labs (CBC with diff, ferritin).   Lequita Asal, MD  09/17/2016, 7:00 PM

## 2016-09-18 ENCOUNTER — Inpatient Hospital Stay: Payer: 59

## 2016-09-18 ENCOUNTER — Inpatient Hospital Stay: Payer: 59 | Admitting: Hematology and Oncology

## 2017-04-14 ENCOUNTER — Other Ambulatory Visit: Payer: Self-pay

## 2017-04-14 ENCOUNTER — Emergency Department
Admission: EM | Admit: 2017-04-14 | Discharge: 2017-04-14 | Disposition: A | Discharge status: Home / Self Care | Payer: Medicaid Other | Attending: Emergency Medicine | Admitting: Emergency Medicine

## 2017-04-14 ENCOUNTER — Encounter: Payer: Self-pay | Admitting: Emergency Medicine

## 2017-04-14 DIAGNOSIS — Z79899 Other long term (current) drug therapy: Secondary | ICD-10-CM | POA: Diagnosis not present

## 2017-04-14 DIAGNOSIS — R059 Cough, unspecified: Secondary | ICD-10-CM

## 2017-04-14 DIAGNOSIS — R05 Cough: Secondary | ICD-10-CM | POA: Insufficient documentation

## 2017-04-14 DIAGNOSIS — I10 Essential (primary) hypertension: Secondary | ICD-10-CM | POA: Diagnosis not present

## 2017-04-14 DIAGNOSIS — M069 Rheumatoid arthritis, unspecified: Secondary | ICD-10-CM | POA: Diagnosis not present

## 2017-04-14 MED ORDER — DEXAMETHASONE SODIUM PHOSPHATE 10 MG/ML IJ SOLN
10.0000 mg | Freq: Once | INTRAMUSCULAR | Status: AC
  Administered 2017-04-14: 10 mg via INTRAMUSCULAR
  Filled 2017-04-14: qty 1

## 2017-04-14 MED ORDER — ACETAMINOPHEN-CODEINE #3 300-30 MG PO TABS: 1.0000 | ORAL_TABLET | Freq: Two times a day (BID) | ORAL | 0 refills | Status: AC | PRN

## 2017-04-14 MED ORDER — PREDNISONE 10 MG (21) PO TBPK: ORAL_TABLET | ORAL | 0 refills | Status: AC

## 2017-04-14 MED ORDER — BENZONATATE 100 MG PO CAPS: ORAL_CAPSULE | ORAL | 0 refills | Status: AC

## 2017-04-14 NOTE — ED Triage Notes (Signed)
Pt ambulatory to triage in nad, report productive cough x 3 weeks, green in color.  Pt reports hand pain as well r/t RA, states she usually gets prednisone from PCP for it but was unable to see PCP due to work.

## 2017-04-14 NOTE — Discharge Instructions (Addendum)
Your exam is consistent with a likely viral URI and cough. Take OTC Delsym as needed for cough. Take the prescription Tessalon Perles and steroid as directed. Follow-up with your provider or return as needed.

## 2017-04-14 NOTE — ED Provider Notes (Signed)
[facility] Emergency Department Provider Note ____________________________________________  Time seen: 1555  I have reviewed the triage vital signs and the nursing notes.  HISTORY  Chief Complaint  Hand Pain and Cough  HPI Cheryl Santana is a 37 y.o. female presents to the ED for evaluation of persistent cough for the last 2 weeks.  Patient describes intermittent cough over the last 2 weeks.  She describes being sick about 2 and half weeks earlier.  She notes she got over what she thought was a common cold and has had persistent cough.  She describes cough is intermittently productive.  She denies any interim fevers, chest pain, or shortness of breath.  She has been taking over-the-counter DayQuil and Mucinex without significant benefit.  She denies a history of asthma or bronchitis.  Past Medical History:  Diagnosis Date  . Anemia   . Arthritis   . Collagen vascular disease (HCC)    lupus  . Hypertension   . Pancreatitis     Patient Active Problem List   Diagnosis Date Noted  . Iron deficiency anemia 07/01/2014    Past Surgical History:  Procedure Laterality Date  . c-section    . CESAREAN SECTION    . CHOLECYSTECTOMY      Prior to Admission medications   Medication Sig Start Date End Date Taking? Authorizing Provider  benzonatate (TESSALON PERLES) 100 MG capsule Take 1-2 tabs TID prn cough 04/14/17   Luisfelipe Engelstad, Charlesetta Ivory, PA-C  fluconazole (DIFLUCAN) 150 MG tablet Take 150 mg by mouth as needed. 07/21/16   [provider]  hydrochlorothiazide (HYDRODIURIL) 25 MG tablet Take 1 tablet (25 mg total) by mouth daily. 04/12/16   Darci Current, MD  meloxicam (MOBIC) 7.5 MG tablet Take 7.5 mg by mouth daily. 07/31/16   [provider]  predniSONE (STERAPRED UNI-PAK 21 TAB) 10 MG (21) TBPK tablet 6-day taper as directed. 04/14/17   Greycen Felter, Charlesetta Ivory, PA-C    Allergies Hydroxychloroquine  Family History  Problem Relation Age of  Onset  . Anemia Mother   . Hypertension Mother   . Anemia Paternal Aunt   . Anemia Cousin   . Hypertension Father   . Cancer Other        great aunt- colon cancer    Social History Social History   Tobacco Use  . Smoking status: Never Smoker  . Smokeless tobacco: Never Used  Substance Use Topics  . Alcohol use: No  . Drug use: No    Review of Systems  Constitutional: Negative for fever. Eyes: Negative for visual changes. ENT: Negative for sore throat. Cardiovascular: Negative for chest pain. Respiratory: Negative for shortness of breath.  Positive for cough. Gastrointestinal: Negative for abdominal pain, vomiting and diarrhea. Musculoskeletal: Negative for back pain. Skin: Negative for rash. Neurological: Negative for headaches, focal weakness or numbness. ____________________________________________  PHYSICAL EXAM:  VITAL SIGNS: ED Triage Vitals  Enc Vitals Group     BP 04/14/17 1516 (!) 162/101     Pulse Rate 04/14/17 1516 (!) 102     Resp 04/14/17 1516 16     Temp 04/14/17 1516 98.7 F (37.1 C)     Temp Source 04/14/17 1516 Oral     SpO2 04/14/17 1516 99 %     Weight 04/14/17 1517 (!) 320 lb (145.2 kg)     Height 04/14/17 1517 5\' 7"  (1.702 m)     Head Circumference --      Peak Flow --  Pain Score 04/14/17 1516 9     Pain Loc --      Pain Edu? --      Excl. in GC? --     Constitutional: Alert and oriented. Well appearing and in no distress. Head: Normocephalic and atraumatic. Eyes: Conjunctivae are normal. PERRL. Normal extraocular movements Ears: Canals clear. TMs intact bilaterally. Nose: No congestion/rhinorrhea/epistaxis. Mouth/Throat: Mucous membranes are moist. Neck: Supple. No thyromegaly. Hematological/Lymphatic/Immunological: No cervical lymphadenopathy. Cardiovascular: Normal rate, regular rhythm. Normal distal pulses. Respiratory: Normal respiratory effort. No wheezes/rales/rhonchi. Musculoskeletal: Nontender with normal range of  motion in all extremities.  Some mild edema is noted at the PIP of the left middle finger.  Normal composite fist. Neurologic:  Normal gait without ataxia. Normal speech and language. No gross focal neurologic deficits are appreciated. Skin:  Skin is warm, dry and intact. No rash noted. ___________________________________________  PROCEDURES  Procedures Decadron 10 mg IM ____________________________________________  INITIAL IMPRESSION / ASSESSMENT AND PLAN / ED COURSE  Patient with a ED evaluation of persistent, intermittent cough.  Patient's exam is overall benign at this presentation.  Her symptoms likely represent a viral etiology.  She is discharged with a prescription for prednisone as well as Lawyer.  She is encouraged to take over-the-counter Delsym for cough relief.  She will follow-up with her primary provider or return to the ED as needed. ____________________________________________  FINAL CLINICAL IMPRESSION(S) / ED DIAGNOSES  Final diagnoses:  Cough  Rheumatoid arthritis involving both hands, unspecified rheumatoid factor presence (HCC)      Karmen Stabs, Charlesetta Ivory, PA-C 04/14/17 1657    Jeanmarie Plant, MD 04/14/17 2127

## 2018-11-04 IMAGING — CT CT HEAD W/O CM
3 series · 15 of 47 positions shown, 18 images · non-contrast
Comparison: 12/16/2005

CLINICAL DATA: Headache since [REDACTED] night.

EXAM:
CT HEAD WITHOUT CONTRAST
TECHNIQUE: Contiguous axial images were obtained from the base of the skull
through the vertex without intravenous contrast.

[Series 2: head wo · axial · 0.41mm/px · z∈[-105,+20]mm · 9 of 30 slices shown, 12 images]
[im 3/30  brain]
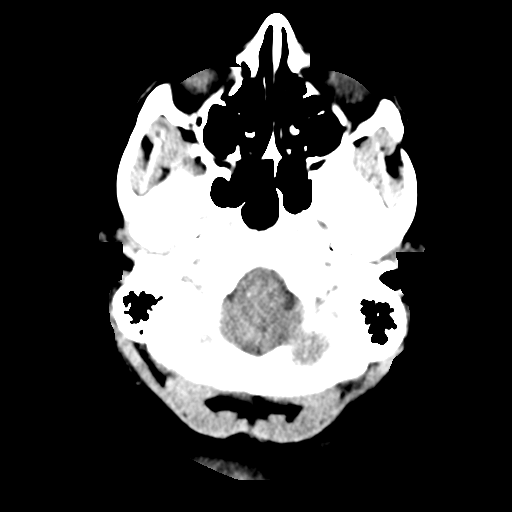
[im 3/30  bone]
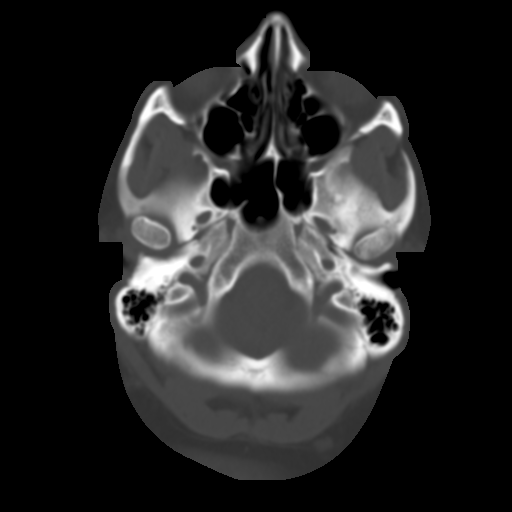
[im 6/30  brain]
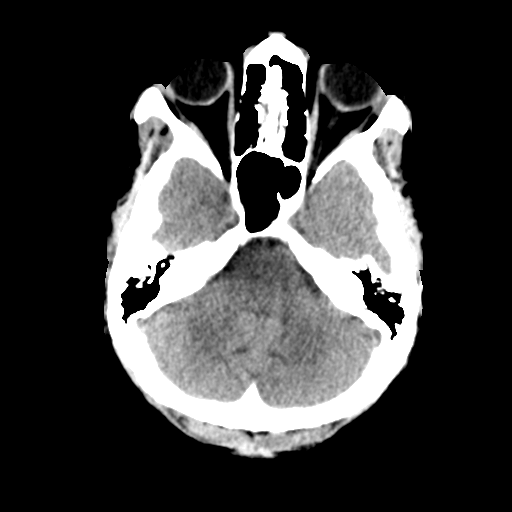
[im 9/30  brain]
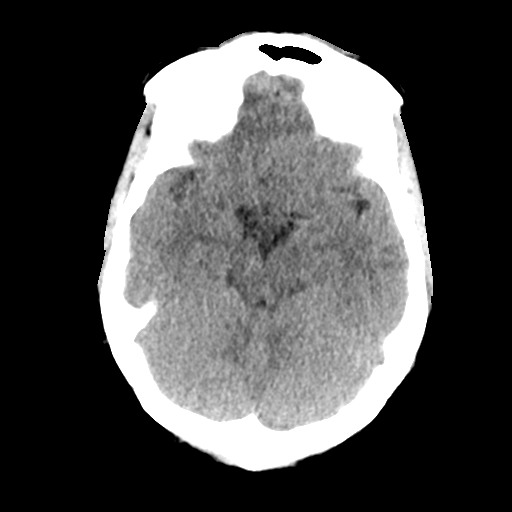
[im 12/30  brain]
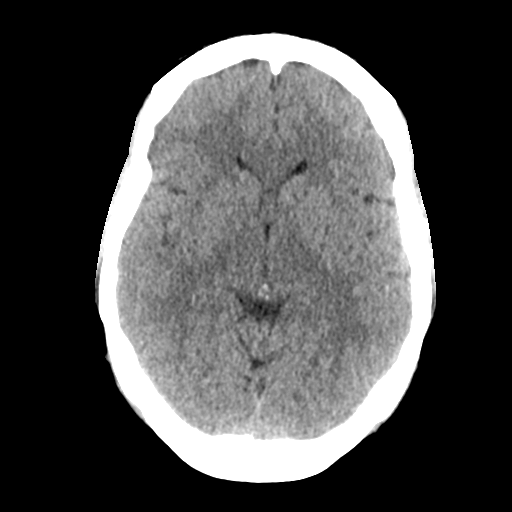
[im 16/30  brain]
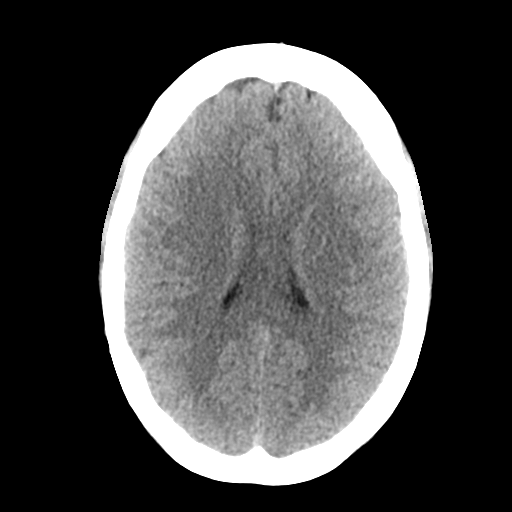
[im 16/30  bone]
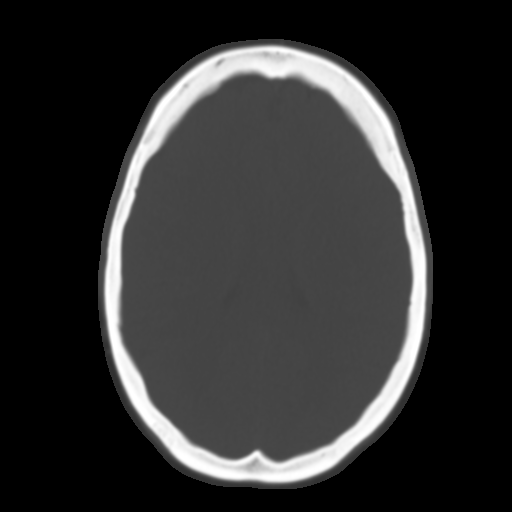
[im 19/30  brain]
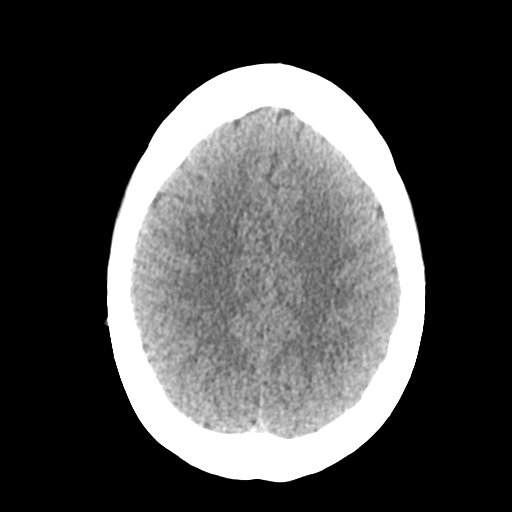
[im 22/30  brain]
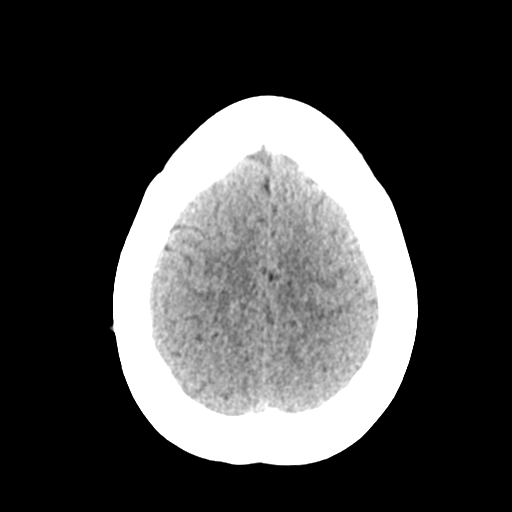
[im 25/30  brain]
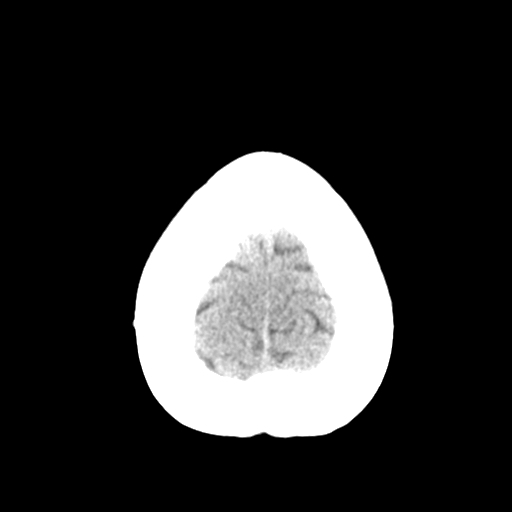
[im 28/30  brain]
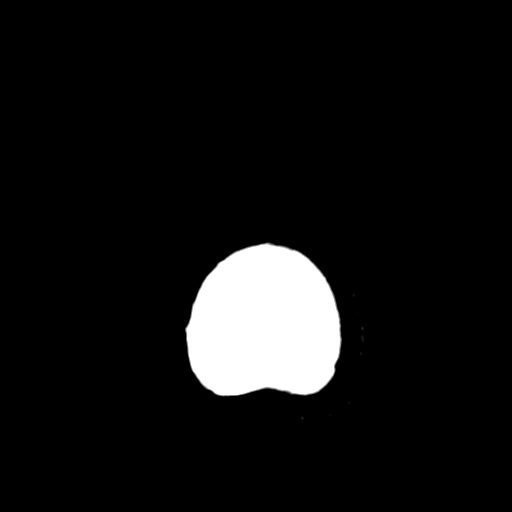
[im 28/30  bone]
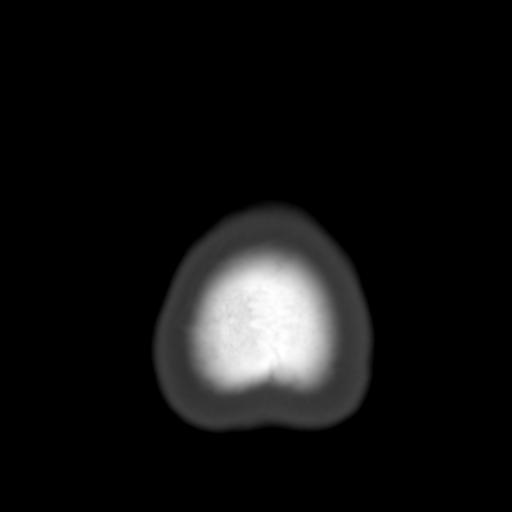

[Series 4: coronal soft tissue · coronal · 0.33mm/px · 3 of 63 slices shown]
[im 21/63  brain]
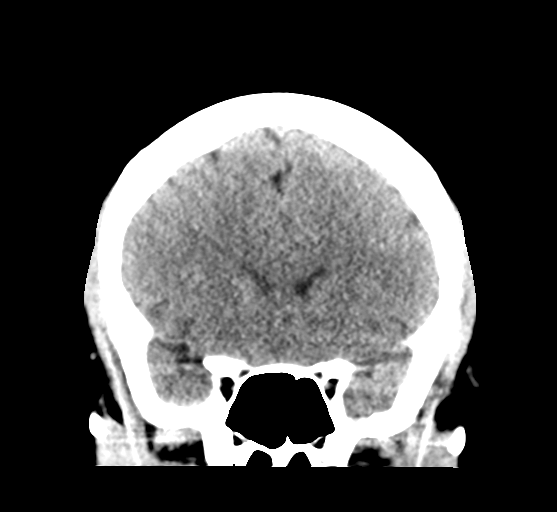
[im 28/63  brain]
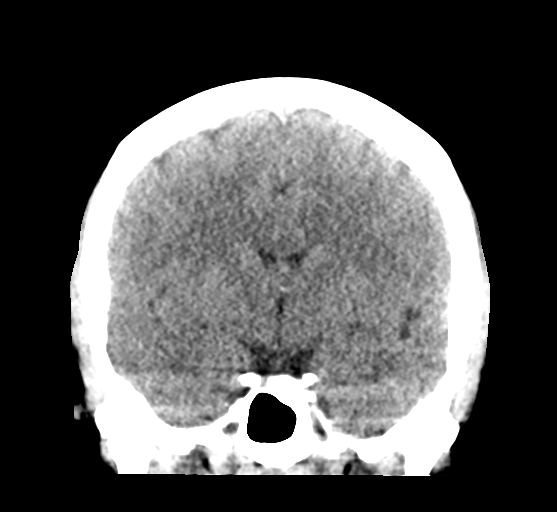
[im 35/63  brain]
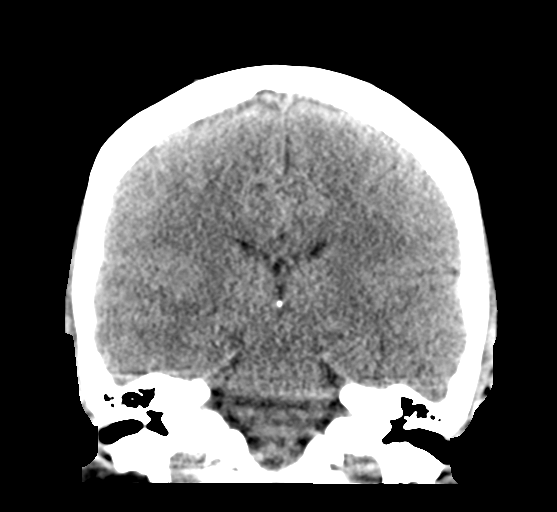

[Series 5: sagittal soft tissue · sagittal · 0.31mm/px · 3 of 49 slices shown]
[im 17/49  brain]
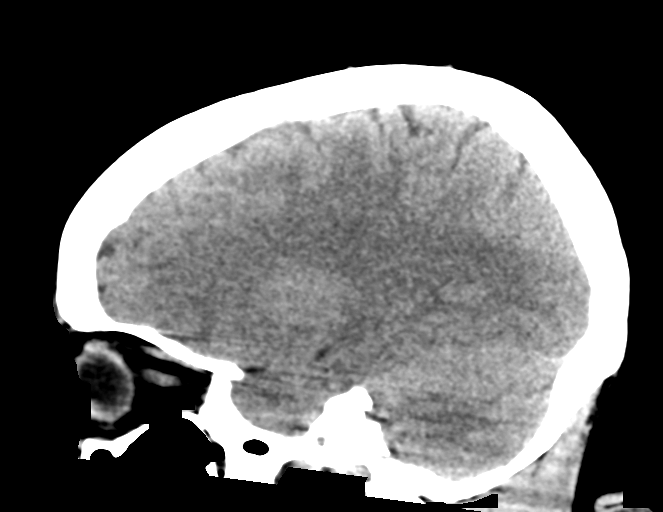
[im 25/49  brain]
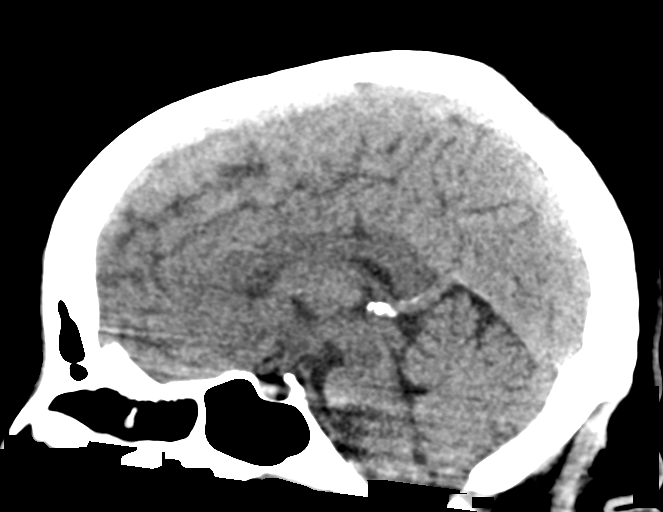
[im 33/49  brain]
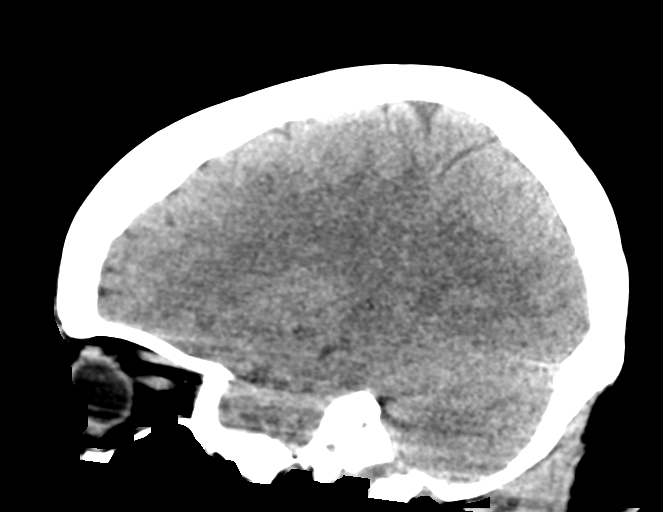

[15 of 47 positions shown; findings below may reference images not displayed]

FINDINGS: BRAIN: The ventricles and sulci are normal. No intraparenchymal
hemorrhage, mass effect nor midline shift. No acute large vascular
territory infarcts. Grey-white matter distinction is maintained. The
basal ganglia are unremarkable. No abnormal extra-axial fluid
collections. Basal cisterns are patent. The brainstem and cerebellar
hemispheres are without acute abnormalities.

VASCULAR: Unremarkable.

SKULL/SOFT TISSUES: No skull fracture. No significant soft tissue
swelling.

ORBITS/SINUSES: The included ocular globes and orbital contents are
normal.The mastoid air cells are clear. The included paranasal
sinuses are well-aerated.

OTHER: None.
IMPRESSION: No acute intracranial abnormality.

## 2019-01-20 ENCOUNTER — Other Ambulatory Visit: Payer: Self-pay

## 2019-01-20 ENCOUNTER — Ambulatory Visit: Payer: Self-pay | Admitting: Physician Assistant

## 2019-01-20 DIAGNOSIS — N760 Acute vaginitis: Secondary | ICD-10-CM

## 2019-01-20 DIAGNOSIS — Z113 Encounter for screening for infections with a predominantly sexual mode of transmission: Secondary | ICD-10-CM

## 2019-01-20 DIAGNOSIS — Z90710 Acquired absence of both cervix and uterus: Secondary | ICD-10-CM

## 2019-01-20 DIAGNOSIS — B9689 Other specified bacterial agents as the cause of diseases classified elsewhere: Secondary | ICD-10-CM

## 2019-01-20 LAB — WET PREP FOR TRICH, YEAST, CLUE
Clue Cell Exam: POSITIVE — AB
Trichomonas Exam: NEGATIVE
Yeast Exam: NEGATIVE

## 2019-01-20 MED ORDER — METRONIDAZOLE 500 MG PO TABS: 500.0000 mg | ORAL_TABLET | Freq: Two times a day (BID) | ORAL | 0 refills | Status: AC

## 2019-01-20 NOTE — Progress Notes (Signed)
Wet mount reviewed with provider and pt treated for BV per Sadie Haber, PA verbal order. Provider orders completed.Lyman Speller, RN

## 2019-01-21 ENCOUNTER — Encounter: Payer: Self-pay | Admitting: Physician Assistant

## 2019-01-21 DIAGNOSIS — Z90710 Acquired absence of both cervix and uterus: Secondary | ICD-10-CM | POA: Insufficient documentation

## 2019-01-21 NOTE — Progress Notes (Signed)
[facility] Department STI clinic/screening visit  Subjective:  Cheryl Santana is a 39 y.o. female being seen today for an STI screening visit. The patient reports they do have symptoms.  Patient reports that they do not desire a pregnancy in the next year.   They reported they are not interested in discussing contraception today.  No LMP recorded (approximate). (Menstrual status: IUD).   Patient has the following medical conditions:   Patient Active Problem List   Diagnosis Date Noted  . Iron deficiency anemia 07/01/2014    Chief Complaint  Patient presents with  . SEXUALLY TRANSMITTED DISEASE    HPI  Patient reports that she has had a white discharge with an odor for "a while".  Denies other concerns.  States that she has had a hysterectomy due to bleeding and still has her ovaries.    See flowsheet for further details and programmatic requirements.    The following portions of the patient's history were reviewed and updated as appropriate: allergies, current medications, past medical history, past social history, past surgical history and problem list.  Objective:  There were no vitals filed for this visit.  Physical Exam Constitutional:      General: She is not in acute distress.    Appearance: Normal appearance. She is obese.  HENT:     Head: Normocephalic and atraumatic.     Comments: No nits, lice, or hair loss. No cervical, supraclavicular or axillary adenopathy.    Mouth/Throat:     Mouth: Mucous membranes are moist.     Pharynx: Oropharynx is clear. No oropharyngeal exudate or posterior oropharyngeal erythema.  Eyes:     Conjunctiva/sclera: Conjunctivae normal.  Pulmonary:     Effort: Pulmonary effort is normal.  Abdominal:     Palpations: Abdomen is soft. There is no mass.     Tenderness: There is no abdominal tenderness. There is no guarding or rebound.  Genitourinary:    General: Normal vulva.     Rectum: Normal.     Comments: External  genitalia/pubic area without nits, lice, edema, erythema, lesions and inguinal adenopathy. Vagina with normal mucosa and small amount of thin, white discharge with pH=>4.5. Cervix and uterus surgically absent. Musculoskeletal:     Cervical back: Neck supple. No tenderness.  Skin:    General: Skin is warm and dry.     Findings: No bruising, erythema, lesion or rash.  Neurological:     Mental Status: She is alert and oriented to person, place, and time.  Psychiatric:        Mood and Affect: Mood normal.        Behavior: Behavior normal.        Thought Content: Thought content normal.        Judgment: Judgment normal.      Assessment and Plan:  Cheryl Santana is a 39 y.o. female presenting to the [facility] Department for STI screening  1. Screening for STD (sexually transmitted disease) Patient into clinic with symptoms. Rec condoms with all sex. Await test results.  Counseled that RN will call if needs to RTC for further treatment once results are back.  - WET PREP FOR [place], YEAST, CLUE - Chlamydia/Gonorrhea [place] [facility] - HIV [place] [facility] - Syphilis Serology, [facility]  2. BV (bacterial vaginosis) Will treat for BV with Metronidazole 500mg  #14 1 po BID for 7 days with food, no EtOH for 24 hr before and until 72 hr after completing medicine. No  sex for 7 days. Enc to use OTC antifungal cream if has itching during or just after antibiotic use. - metroNIDAZOLE (FLAGYL) 500 MG tablet; Take 1 tablet (500 mg total) by mouth 2 (two) times daily for 7 days.  Dispense: 14 tablet; Refill: 0     No follow-ups on file.  No future appointments.  Matt Holmes, PA

## 2019-02-02 ENCOUNTER — Emergency Department
Admission: EM | Admit: 2019-02-02 | Discharge: 2019-02-02 | Disposition: A | Discharge status: Home / Self Care | Payer: Medicaid Other | Attending: Emergency Medicine | Admitting: Emergency Medicine

## 2019-02-02 ENCOUNTER — Other Ambulatory Visit: Payer: Self-pay

## 2019-02-02 ENCOUNTER — Encounter: Payer: Self-pay | Admitting: Emergency Medicine

## 2019-02-02 DIAGNOSIS — Y92013 Bedroom of single-family (private) house as the place of occurrence of the external cause: Secondary | ICD-10-CM | POA: Insufficient documentation

## 2019-02-02 DIAGNOSIS — Y998 Other external cause status: Secondary | ICD-10-CM | POA: Insufficient documentation

## 2019-02-02 DIAGNOSIS — Y9384 Activity, sleeping: Secondary | ICD-10-CM | POA: Insufficient documentation

## 2019-02-02 DIAGNOSIS — I10 Essential (primary) hypertension: Secondary | ICD-10-CM | POA: Insufficient documentation

## 2019-02-02 DIAGNOSIS — Z79899 Other long term (current) drug therapy: Secondary | ICD-10-CM | POA: Insufficient documentation

## 2019-02-02 DIAGNOSIS — S46011A Strain of muscle(s) and tendon(s) of the rotator cuff of right shoulder, initial encounter: Secondary | ICD-10-CM | POA: Insufficient documentation

## 2019-02-02 DIAGNOSIS — X509XXA Other and unspecified overexertion or strenuous movements or postures, initial encounter: Secondary | ICD-10-CM | POA: Insufficient documentation

## 2019-02-02 MED ORDER — HYDROCODONE-ACETAMINOPHEN 5-325 MG PO TABS
1.0000 | ORAL_TABLET | Freq: Once | ORAL | Status: AC
  Administered 2019-02-02: 1 via ORAL
  Filled 2019-02-02: qty 1

## 2019-02-02 MED ORDER — IBUPROFEN 800 MG PO TABS
800.0000 mg | ORAL_TABLET | ORAL | Status: AC
  Administered 2019-02-02: 04:00:00 800 mg via ORAL
  Filled 2019-02-02: qty 1

## 2019-02-02 MED ORDER — HYDROCODONE-ACETAMINOPHEN 5-325 MG PO TABS: 1.0000 | ORAL_TABLET | Freq: Four times a day (QID) | ORAL | 0 refills | Status: AC | PRN

## 2019-02-02 NOTE — ED Triage Notes (Signed)
Patient states that she slept wrong last night. Patient with complaint of pain to right shoulder radiating down to her back.

## 2019-02-02 NOTE — ED Provider Notes (Signed)
[facility] Emergency Department Provider Note   ____________________________________________   First MD Initiated Contact with Patient 02/02/19 0255     (approximate)  I have reviewed the triage vital signs and the nursing notes.   HISTORY  Chief Complaint Shoulder Pain    HPI Cheryl Santana is a 39 y.o. female here for evaluation of right shoulder pain  Patient reports about a day ago she slept on her right side with her arm in a strange position.  When she woke up the area is been sore and painful since.  Hurts quite a bit when she moves her right shoulder especially if she reaches behind her  Denies previous injury.  No falls.  No chest pain.  No shortness of breath, but when she does move about she gets pain in the right upper shoulder and behind the right shoulder  She tried a muscle relaxant Flexeril at home that she had with no relief  Pain is mild at rest, but exacerbated quite a bit with any use of the right shoulder.  Denies pregnancy.   Past Medical History:  Diagnosis Date  . Anemia   . Arthritis   . Collagen vascular disease (HCC)    lupus  . Hypertension   . Pancreatitis     Patient Active Problem List   Diagnosis Date Noted  . History of hysterectomy 01/21/2019  . Iron deficiency anemia 07/01/2014    Past Surgical History:  Procedure Laterality Date  . ABDOMINAL HYSTERECTOMY    . c-section    . CESAREAN SECTION    . CHOLECYSTECTOMY      Prior to Admission medications   Medication Sig Start Date End Date Taking? Authorizing Provider  acetaminophen-codeine (TYLENOL #3) 300-30 MG tablet Take 1 tablet by mouth 2 (two) times daily as needed for moderate pain. 04/14/17   Menshew, Dannielle Karvonen, PA-C  benzonatate (TESSALON PERLES) 100 MG capsule Take 1-2 tabs TID prn cough 04/14/17   Menshew, Dannielle Karvonen, PA-C  fluconazole (DIFLUCAN) 150 MG tablet Take 150 mg by mouth as needed. 07/21/16   [provider]    hydrochlorothiazide (HYDRODIURIL) 25 MG tablet Take 1 tablet (25 mg total) by mouth daily. 04/12/16   Gregor Hams, MD  HYDROcodone-acetaminophen (NORCO/VICODIN) 5-325 MG tablet Take 1-2 tablets by mouth every 6 (six) hours as needed for moderate pain. 02/02/19   Delman Kitten, MD  meloxicam (MOBIC) 7.5 MG tablet Take 7.5 mg by mouth daily. 07/31/16   [provider]  predniSONE (STERAPRED UNI-PAK 21 TAB) 10 MG (21) TBPK tablet 6-day taper as directed. 04/14/17   Menshew, Dannielle Karvonen, PA-C    Allergies Hydroxychloroquine  Family History  Problem Relation Age of Onset  . Anemia Mother   . Hypertension Mother   . Anemia Paternal Aunt   . Anemia Cousin   . Hypertension Father   . Cancer Other        great aunt- colon cancer    Social History Social History   Tobacco Use  . Smoking status: Never Smoker  . Smokeless tobacco: Never Used  Substance Use Topics  . Alcohol use: No  . Drug use: No    Review of Systems Constitutional: No fever/chills no exposure to Covid Eyes: No visual changes. ENT: No sore throat. Cardiovascular: Denies chest pain. Respiratory: Denies shortness of breath. Insert   Musculoskeletal: Negative for back pain.  See HPI Skin: Negative for rash. Neurological: Negative for headaches, weakness or numbness.  ____________________________________________   PHYSICAL EXAM:  VITAL SIGNS: ED Triage Vitals  Enc Vitals Group     BP 02/02/19 0109 (!) 178/97     Pulse --      Resp 02/02/19 0109 16     Temp 02/02/19 0109 98.4 F (36.9 C)     Temp Source 02/02/19 0109 Oral     SpO2 02/02/19 0109 96 %     Weight 02/02/19 0058 275 lb (124.7 kg)     Height 02/02/19 0058 5\' 7"  (1.702 m)     Head Circumference --      Peak Flow --      Pain Score 02/02/19 0058 10     Pain Loc --      Pain Edu? --      Excl. in GC? --     Constitutional: Alert and oriented. Well appearing and in no acute distress. Eyes: Conjunctivae are normal. Head:  Atraumatic. Nose: No congestion/rhinnorhea. Mouth/Throat: Mucous membranes are moist. Neck: No stridor.  Cardiovascular: Normal rate, regular rhythm. Grossly normal heart sounds.  Good peripheral circulation. Respiratory: Normal respiratory effort.  No retractions. Lungs CTAB. Gastrointestinal: Soft and nontender. No distention. Musculoskeletal:  RIGHT Right upper extremity demonstrates normal strength, good use of all muscles some limitation especially with isolation reaching behind her back she reports pain especially across the right superior posterior shoulder region. No edema bruising or contusions of the right shoulder/upper arm, right elbow, right forearm / hand.  There is focal and localized tenderness to palpation across the posterior right shoulder without deformity.  Full range of motion of the right right upper extremity with some pain discomfort especially with reaching behind herself. No evidence of trauma. Strong radial pulse. Intact median/ulnar/radial neuro-muscular exam.  LEFT Grossly normal use, able to sit herself up and use the left arm without difficulty.  Neurologic:  Normal speech and language. No gross focal neurologic deficits are appreciated.  Skin:  Skin is warm, dry and intact. No rash noted. Psychiatric: Mood and affect are normal. Speech and behavior are normal.  ____________________________________________   LABS (all labs ordered are listed, but only abnormal results are displayed)  Labs Reviewed - No data to display ____________________________________________  EKG   ____________________________________________  RADIOLOGY  Discussed obtaining x-rays with the patient of the shoulder, and also to make sure that there is no signs of injury or bony injury fracture or problem with the right upper lung.  Clinically however this appears consistent with musculoskeletal etiology involving the right shoulder especially likely strain of the rotator cuff.  With  shared medical decision making, patient declined obtaining imaging studies and I think this is very reasonable given there was no reported fall or injury and this obviously started after waking up.  There is no signs of deformity or dislocation  Additionally, discussed for surety to obtain an EKG possibly given shoulder pain but she denies that she is having any chest pain and did not wish to have this performed.  I think is also reasonable, her exam again seems most consistent with musculoskeletal pain appears very low risk to represent ACS by any of her clinical history for which she is having no chest pain and no left-sided symptoms. ____________________________________________   PROCEDURES  Procedure(s) performed: None  Procedures  Critical Care performed: No  ____________________________________________   INITIAL IMPRESSION / ASSESSMENT AND PLAN / ED COURSE  Pertinent labs & imaging results that were available during my care of the patient were reviewed by me and considered  in my medical decision making (see chart for details).   Right shoulder pain.  Most consistent with right shoulder strain possibly of the rotator cuff.  Advised the patient to rest, will trial anti-inflammatories, follow-up with orthopedics, brief prescription for pain medications which I checked the day database and she also does not take any prescription pain medicines.  She is agreeable to not driving, did not drive herself today.  Return precautions and treatment recommendations and follow-up discussed with the patient who is agreeable with the plan.       ____________________________________________   FINAL CLINICAL IMPRESSION(S) / ED DIAGNOSES  Final diagnoses:  Rotator cuff strain, right, initial encounter        Note:  This document was prepared using Dragon voice recognition software and may include unintentional dictation errors       Sharyn Creamer, MD 02/02/19 0327

## 2019-06-29 ENCOUNTER — Emergency Department
Admission: EM | Admit: 2019-06-29 | Discharge: 2019-06-29 | Disposition: A | Discharge status: Home / Self Care | Payer: Self-pay | Attending: Emergency Medicine | Admitting: Emergency Medicine

## 2019-06-29 ENCOUNTER — Emergency Department: Payer: Self-pay

## 2019-06-29 ENCOUNTER — Other Ambulatory Visit: Payer: Self-pay

## 2019-06-29 DIAGNOSIS — I10 Essential (primary) hypertension: Secondary | ICD-10-CM | POA: Insufficient documentation

## 2019-06-29 DIAGNOSIS — Z79899 Other long term (current) drug therapy: Secondary | ICD-10-CM | POA: Insufficient documentation

## 2019-06-29 DIAGNOSIS — M545 Low back pain, unspecified: Secondary | ICD-10-CM

## 2019-06-29 LAB — URINALYSIS, ROUTINE W REFLEX MICROSCOPIC
Bilirubin Urine: NEGATIVE
Glucose, UA: NEGATIVE mg/dL
Hgb urine dipstick: NEGATIVE
Ketones, ur: NEGATIVE mg/dL
Leukocytes,Ua: NEGATIVE
Nitrite: NEGATIVE
Protein, ur: NEGATIVE mg/dL
Specific Gravity, Urine: 1.02 (ref 1.005–1.030)
pH: 5 (ref 5.0–8.0)

## 2019-06-29 MED ORDER — DEXAMETHASONE SODIUM PHOSPHATE 10 MG/ML IJ SOLN
10.0000 mg | Freq: Once | INTRAMUSCULAR | Status: AC
  Administered 2019-06-29: 10 mg via INTRAMUSCULAR
  Filled 2019-06-29: qty 1

## 2019-06-29 MED ORDER — TRAMADOL HCL 50 MG PO TABS: 50.0000 mg | ORAL_TABLET | Freq: Four times a day (QID) | ORAL | 0 refills | Status: AC | PRN

## 2019-06-29 MED ORDER — CYCLOBENZAPRINE HCL 10 MG PO TABS
10.0000 mg | ORAL_TABLET | Freq: Once | ORAL | Status: AC
  Administered 2019-06-29: 10 mg via ORAL
  Filled 2019-06-29: qty 1

## 2019-06-29 MED ORDER — CYCLOBENZAPRINE HCL 10 MG PO TABS: 10.0000 mg | ORAL_TABLET | Freq: Three times a day (TID) | ORAL | 0 refills | Status: AC | PRN

## 2019-06-29 NOTE — ED Notes (Signed)
Pt denies any pain or difficulty with urination. Pt ambulatory to ED Flex room.

## 2019-06-29 NOTE — ED Provider Notes (Signed)
[facility] Emergency Department Provider Note ____________________________________________  Time seen: 1215  I have reviewed the triage vital signs and the nursing notes.  HISTORY  Chief Complaint  Back Pain   HPI BLAKELEE ALLINGTON is a 39 y.o. female presents to the ER with complaint plaint of bilateral low back pain.  She reports this started 3 days ago.  She describes the pain as sharp and stabbing.  The pain does not radiate.  The pain is worse with changing positions.  She denies numbness, tingling or weakness of her lower extremities.  She denies any injury to the area that she is aware of.  She reports she has had similar back pain in the past that was treated with muscle relaxers with good improvement in symptoms.  She reports she has been drinking a lot of sodas lately in her urine has been darker than normal, she is unsure if this is related.  She denies urgency, frequency, dysuria, blood in her urine, vaginal discharge, vaginal odor, vaginal irritation or abnormal uterine bleeding.  She has taken Hydrocodone with minimal relief of symptoms.  Past Medical History:  Diagnosis Date   Anemia    Arthritis    Collagen vascular disease (HCC)    lupus   Hypertension    Pancreatitis     Patient Active Problem List   Diagnosis Date Noted   History of hysterectomy 01/21/2019   Iron deficiency anemia 07/01/2014    Past Surgical History:  Procedure Laterality Date   ABDOMINAL HYSTERECTOMY     c-section     CESAREAN SECTION     CHOLECYSTECTOMY      Prior to Admission medications   Medication Sig Start Date End Date Taking? Authorizing Provider  acetaminophen-codeine (TYLENOL #3) 300-30 MG tablet Take 1 tablet by mouth 2 (two) times daily as needed for moderate pain. 04/14/17   Menshew, Charlesetta Ivory, PA-C  benzonatate (TESSALON PERLES) 100 MG capsule Take 1-2 tabs TID prn cough 04/14/17   Menshew, Charlesetta Ivory, PA-C  cyclobenzaprine (FLEXERIL) 10  MG tablet Take 1 tablet (10 mg total) by mouth 3 (three) times daily as needed for muscle spasms. 06/29/19   Lorre Munroe, NP  fluconazole (DIFLUCAN) 150 MG tablet Take 150 mg by mouth as needed. 07/21/16   [provider]  hydrochlorothiazide (HYDRODIURIL) 25 MG tablet Take 1 tablet (25 mg total) by mouth daily. 04/12/16   Darci Current, MD  HYDROcodone-acetaminophen (NORCO/VICODIN) 5-325 MG tablet Take 1-2 tablets by mouth every 6 (six) hours as needed for moderate pain. 02/02/19   Sharyn Creamer, MD  meloxicam (MOBIC) 7.5 MG tablet Take 7.5 mg by mouth daily. 07/31/16   [provider]  predniSONE (STERAPRED UNI-PAK 21 TAB) 10 MG (21) TBPK tablet 6-day taper as directed. 04/14/17   Menshew, Charlesetta Ivory, PA-C  traMADol (ULTRAM) 50 MG tablet Take 1 tablet (50 mg total) by mouth every 6 (six) hours as needed. 06/29/19 06/28/20  Lorre Munroe, NP    Allergies Hydroxychloroquine  Family History  Problem Relation Age of Onset   Anemia Mother    Hypertension Mother    Anemia Paternal Aunt    Anemia Cousin    Hypertension Father    Cancer Other        great aunt- colon cancer    Social History Social History   Tobacco Use   Smoking status: Never Smoker   Smokeless tobacco: Never Used  Substance Use Topics   Alcohol use:  No   Drug use: No    Review of Systems  Constitutional: Negative for fever, chills or body. Cardiovascular: Negative for chest pain or chest tightness. Respiratory: Negative for cough or shortness of breath. Gastrointestinal: Negative for abdominal pain. Genitourinary: Negative for urinary urgency, frequency, dysuria, blood in urine, vaginal discharge, vaginal odor, vaginal irritation or abnormal uterine bleeding.. Musculoskeletal: Positive for back pain.  Negative for hip or knee pain. Skin: Negative for rash. Neurological: Negative for focal weakness, tingling or numbness. ____________________________________________  PHYSICAL  EXAM:  VITAL SIGNS: ED Triage Vitals  Enc Vitals Group     BP 06/29/19 1132 (!) 157/107     Pulse Rate 06/29/19 1132 89     Resp 06/29/19 1132 16     Temp 06/29/19 1132 98.9 F (37.2 C)     Temp Source 06/29/19 1132 Oral     SpO2 06/29/19 1132 100 %     Weight 06/29/19 1131 (!) 325 lb (147.4 kg)     Height 06/29/19 1131 5\' 7"  (1.702 m)     Head Circumference --      Peak Flow --      Pain Score 06/29/19 1130 10     Pain Loc --      Pain Edu? --      Excl. in [place]? --     Constitutional: Alert and oriented.  Obese: In no distress. Cardiovascular: Normal rate, regular rhythm.  Pedal pulses 2+ bilaterally Respiratory: Normal respiratory effort. No wheezes/rales/rhonchi. Musculoskeletal: Decreased flexion and extension of the spine secondary to pain.  Normal rotation and lateral bending.  Bony tenderness noted over the lumbar spine.  Strength 5/5 BLE.  Gait slow and steady without device. Neurologic:  Normal speech and language. No gross focal neurologic deficits are appreciated. Skin:  Skin is warm, dry and intact. No rash noted. __________________________________   LABS Labs Reviewed  URINALYSIS, ROUTINE W REFLEX MICROSCOPIC - Abnormal; Notable for the following components:      Result Value   Color, Urine YELLOW (*)    APPearance HAZY (*)    All other components within normal limits    ____________________________________________  RADIOLOGY  Imaging Orders     DG Lumbar Spine 2-3 Views IMPRESSION:  Negative.    ____________________________________________   INITIAL IMPRESSION / ASSESSMENT AND PLAN / ED COURSE  Acute Low Back Pain:  Xray lumbar spine negative Urinalysis: normal Flexeril 10 mg PO x 1 Decadron 10 mg IM x 1 RX for Flexeril 10 mg TID prn RX for Tramadol 50 mg 1 tab PO TID prn Encouraged stretching and heat   I reviewed the patient's prescription history over the last 12 months in the multi-state controlled substances database(s) that includes  [place], [place], [place], [place], [place], [place], [place], [place], [place], [place], [place], [place], [place], and [place].  Results were notable for Hydrocodone 5-325 Mg # 12, 01/2019. ____________________________________________  FINAL CLINICAL IMPRESSION(S) / ED DIAGNOSES  Final diagnoses:  Acute bilateral low back pain without sciatica      Jearld Fenton, NP 06/29/19 1459    Earleen Newport, MD 06/29/19 1505

## 2019-06-29 NOTE — Discharge Instructions (Addendum)
You were seen today for acute low back pain.  Your urinalysis did not show any evidence of infection.  Your x-ray was normal.  This is likely muscular.  I recommend heat and stretching.  I am giving you prescriptions for muscle relaxers and pain medication to take every 8 hours as needed.  Please be aware that these may cause sedation.  Follow-up for new or worsening pain, lower extremity weakness or loss of bowel or bladder control.

## 2019-06-29 NOTE — ED Triage Notes (Signed)
Pt c/o low back pain since Thursday. States has been drinking soda. States urine was dark so been drinking water. Denies dysuria. Denies hematuria. A&O, ambulatory.

## 2019-06-29 NOTE — ED Notes (Signed)
Pt verbalized understanding of discharge instructions. NAD at this time. 

## 2022-06-07 ENCOUNTER — Other Ambulatory Visit: Payer: Self-pay

## 2022-06-07 ENCOUNTER — Encounter: Payer: Self-pay | Admitting: Hematology and Oncology

## 2022-06-07 DIAGNOSIS — Z1231 Encounter for screening mammogram for malignant neoplasm of breast: Secondary | ICD-10-CM

## 2022-06-14 ENCOUNTER — Encounter: Payer: Self-pay | Admitting: Hematology and Oncology

## 2022-06-21 ENCOUNTER — Ambulatory Visit
Admission: RE | Admit: 2022-06-21 | Discharge: 2022-06-21 | Disposition: A | Discharge status: Home / Self Care | Payer: Medicaid Other | Source: Ambulatory Visit | Attending: Family | Admitting: Family

## 2022-06-21 DIAGNOSIS — Z1231 Encounter for screening mammogram for malignant neoplasm of breast: Secondary | ICD-10-CM | POA: Diagnosis not present

## 2022-09-10 DIAGNOSIS — Z419 Encounter for procedure for purposes other than remedying health state, unspecified: Secondary | ICD-10-CM | POA: Diagnosis not present

## 2022-09-27 DIAGNOSIS — Z23 Encounter for immunization: Secondary | ICD-10-CM | POA: Diagnosis not present

## 2022-09-27 DIAGNOSIS — Z113 Encounter for screening for infections with a predominantly sexual mode of transmission: Secondary | ICD-10-CM | POA: Diagnosis not present

## 2022-09-27 DIAGNOSIS — I10 Essential (primary) hypertension: Secondary | ICD-10-CM | POA: Diagnosis not present

## 2022-10-10 DIAGNOSIS — Z419 Encounter for procedure for purposes other than remedying health state, unspecified: Secondary | ICD-10-CM | POA: Diagnosis not present

## 2022-10-16 DIAGNOSIS — I10 Essential (primary) hypertension: Secondary | ICD-10-CM | POA: Diagnosis not present

## 2022-10-16 DIAGNOSIS — N898 Other specified noninflammatory disorders of vagina: Secondary | ICD-10-CM | POA: Diagnosis not present

## 2022-10-16 DIAGNOSIS — A599 Trichomoniasis, unspecified: Secondary | ICD-10-CM | POA: Diagnosis not present

## 2022-11-10 DIAGNOSIS — Z419 Encounter for procedure for purposes other than remedying health state, unspecified: Secondary | ICD-10-CM | POA: Diagnosis not present

## 2022-11-15 DIAGNOSIS — E119 Type 2 diabetes mellitus without complications: Secondary | ICD-10-CM | POA: Diagnosis not present

## 2022-11-15 DIAGNOSIS — Z113 Encounter for screening for infections with a predominantly sexual mode of transmission: Secondary | ICD-10-CM | POA: Diagnosis not present

## 2022-11-15 DIAGNOSIS — I10 Essential (primary) hypertension: Secondary | ICD-10-CM | POA: Diagnosis not present

## 2022-11-19 DIAGNOSIS — N898 Other specified noninflammatory disorders of vagina: Secondary | ICD-10-CM | POA: Diagnosis not present

## 2022-12-10 DIAGNOSIS — Z419 Encounter for procedure for purposes other than remedying health state, unspecified: Secondary | ICD-10-CM | POA: Diagnosis not present

## 2023-01-10 DIAGNOSIS — Z419 Encounter for procedure for purposes other than remedying health state, unspecified: Secondary | ICD-10-CM | POA: Diagnosis not present

## 2023-02-10 DIAGNOSIS — Z419 Encounter for procedure for purposes other than remedying health state, unspecified: Secondary | ICD-10-CM | POA: Diagnosis not present

## 2023-03-06 DIAGNOSIS — I10 Essential (primary) hypertension: Secondary | ICD-10-CM | POA: Diagnosis not present

## 2023-03-06 DIAGNOSIS — R1032 Left lower quadrant pain: Secondary | ICD-10-CM | POA: Diagnosis not present

## 2023-03-06 DIAGNOSIS — R109 Unspecified abdominal pain: Secondary | ICD-10-CM | POA: Diagnosis not present

## 2023-03-06 DIAGNOSIS — Z113 Encounter for screening for infections with a predominantly sexual mode of transmission: Secondary | ICD-10-CM | POA: Diagnosis not present

## 2023-03-06 DIAGNOSIS — N898 Other specified noninflammatory disorders of vagina: Secondary | ICD-10-CM | POA: Diagnosis not present

## 2023-03-10 DIAGNOSIS — N132 Hydronephrosis with renal and ureteral calculous obstruction: Secondary | ICD-10-CM | POA: Diagnosis not present

## 2023-03-10 DIAGNOSIS — Z419 Encounter for procedure for purposes other than remedying health state, unspecified: Secondary | ICD-10-CM | POA: Diagnosis not present

## 2023-03-20 DIAGNOSIS — N760 Acute vaginitis: Secondary | ICD-10-CM | POA: Diagnosis not present

## 2023-04-16 DIAGNOSIS — R030 Elevated blood-pressure reading, without diagnosis of hypertension: Secondary | ICD-10-CM | POA: Diagnosis not present

## 2023-04-16 DIAGNOSIS — N898 Other specified noninflammatory disorders of vagina: Secondary | ICD-10-CM | POA: Diagnosis not present

## 2023-04-21 DIAGNOSIS — Z419 Encounter for procedure for purposes other than remedying health state, unspecified: Secondary | ICD-10-CM | POA: Diagnosis not present

## 2023-05-17 DIAGNOSIS — N898 Other specified noninflammatory disorders of vagina: Secondary | ICD-10-CM | POA: Diagnosis not present

## 2023-05-17 DIAGNOSIS — Z113 Encounter for screening for infections with a predominantly sexual mode of transmission: Secondary | ICD-10-CM | POA: Diagnosis not present

## 2023-05-21 DIAGNOSIS — Z419 Encounter for procedure for purposes other than remedying health state, unspecified: Secondary | ICD-10-CM | POA: Diagnosis not present

## 2023-06-21 DIAGNOSIS — Z419 Encounter for procedure for purposes other than remedying health state, unspecified: Secondary | ICD-10-CM | POA: Diagnosis not present

## 2023-07-21 DIAGNOSIS — Z419 Encounter for procedure for purposes other than remedying health state, unspecified: Secondary | ICD-10-CM | POA: Diagnosis not present

## 2023-08-14 ENCOUNTER — Other Ambulatory Visit: Payer: Self-pay | Admitting: Family

## 2023-08-14 ENCOUNTER — Encounter: Payer: Self-pay | Admitting: Hematology and Oncology

## 2023-08-14 DIAGNOSIS — Z1231 Encounter for screening mammogram for malignant neoplasm of breast: Secondary | ICD-10-CM

## 2023-08-15 ENCOUNTER — Ambulatory Visit
Admission: RE | Admit: 2023-08-15 | Discharge: 2023-08-15 | Disposition: A | Discharge status: Home / Self Care | Source: Ambulatory Visit | Attending: Family | Admitting: Family

## 2023-08-15 DIAGNOSIS — Z1231 Encounter for screening mammogram for malignant neoplasm of breast: Secondary | ICD-10-CM | POA: Insufficient documentation

## 2023-08-21 DIAGNOSIS — Z419 Encounter for procedure for purposes other than remedying health state, unspecified: Secondary | ICD-10-CM | POA: Diagnosis not present

## 2023-09-17 ENCOUNTER — Encounter: Payer: Self-pay | Admitting: Hematology and Oncology

## 2023-09-21 DIAGNOSIS — Z419 Encounter for procedure for purposes other than remedying health state, unspecified: Secondary | ICD-10-CM | POA: Diagnosis not present

## 2023-11-21 DIAGNOSIS — Z419 Encounter for procedure for purposes other than remedying health state, unspecified: Secondary | ICD-10-CM | POA: Diagnosis not present
